# Patient Record
Sex: Male | Born: 1970
Health system: Southern US, Community
[De-identification: ages and names within clinical notes are randomized; demographics above are authoritative.]

## PROBLEM LIST (undated history)

## (undated) DIAGNOSIS — K469 Unspecified abdominal hernia without obstruction or gangrene: Secondary | ICD-10-CM

## (undated) DIAGNOSIS — K219 Gastro-esophageal reflux disease without esophagitis: Secondary | ICD-10-CM

## (undated) DIAGNOSIS — I10 Essential (primary) hypertension: Secondary | ICD-10-CM

## (undated) DIAGNOSIS — E7800 Pure hypercholesterolemia, unspecified: Secondary | ICD-10-CM

## (undated) DIAGNOSIS — R079 Chest pain, unspecified: Secondary | ICD-10-CM

## (undated) HISTORY — DX: Chest pain, unspecified: R07.9

## (undated) HISTORY — PX: HERNIA REPAIR: SHX51

## (undated) HISTORY — DX: Pure hypercholesterolemia, unspecified: E78.00

## (undated) HISTORY — DX: Gastro-esophageal reflux disease without esophagitis: K21.9

## (undated) HISTORY — DX: Unspecified abdominal hernia without obstruction or gangrene: K46.9

---

## 1999-05-21 HISTORY — PX: LAPAROSCOPIC INGUINAL HERNIA REPAIR: SUR788

## 1999-12-17 ENCOUNTER — Ambulatory Visit (HOSPITAL_COMMUNITY): Admission: RE | Admit: 1999-12-17 | Discharge: 1999-12-17 | Payer: Self-pay

## 2005-12-19 ENCOUNTER — Emergency Department (HOSPITAL_COMMUNITY): Admission: EM | Admit: 2005-12-19 | Discharge: 2005-12-19 | Payer: Self-pay | Admitting: Emergency Medicine

## 2007-08-12 ENCOUNTER — Emergency Department (HOSPITAL_COMMUNITY): Admission: EM | Admit: 2007-08-12 | Discharge: 2007-08-12 | Payer: Self-pay | Admitting: Emergency Medicine

## 2008-05-20 HISTORY — PX: INGUINAL HERNIA REPAIR: SUR1180

## 2008-06-22 ENCOUNTER — Ambulatory Visit (HOSPITAL_BASED_OUTPATIENT_CLINIC_OR_DEPARTMENT_OTHER): Admission: RE | Admit: 2008-06-22 | Discharge: 2008-06-22 | Payer: Self-pay | Admitting: Surgery

## 2010-10-02 NOTE — Op Note (Signed)
NAMEULES, MARSALA                ACCOUNT NO.:  [id number]   MEDICAL RECORD NO.:  [id number]          PATIENT TYPE:  AMB   LOCATION:  DSC                          FACILITY:  [facility]   PHYSICIAN:  Currie Paris, M.D.DATE OF BIRTH:  10/29/1970   DATE OF PROCEDURE:  06/22/2008  DATE OF DISCHARGE:                               OPERATIVE REPORT   PREOPERATIVE DIAGNOSIS:  Recurrent left inguinal hernia.   POSTOPERATIVE DIAGNOSIS:  Recurrent left inguinal hernia.   OPERATION:  Repair with mesh.   SURGEON:  Currie Paris, MD   ANESTHESIA:  General.   CLINICAL HISTORY:  This is a 40 year old gentleman who has had a right  inguinal hernia repair and then subsequently had a laparoscopic  bilateral hernia repair apparently, the right side had recurred.  He now  presented with a left inguinal hernia, which was therefore a recurrent  left inguinal hernia.  It was fairly large and felt to be direct.   DESCRIPTION OF PROCEDURE:  The patient was seen in the holding area and  had no further questions.  We reviewed the plans for surgery as noted  above.  He and I both initialed the left inguinal area as the operative  site.   The patient was taken to the operating room and after satisfactory  general anesthesia had been obtained, the left groin area was prepped  and draped.  The time-out was done.   I injected a combination of 1% Xylocaine with epi and 0.5% plain  Marcaine along the incision line and then below the fascia at the  anterior superior iliac spine.  I made an inguinal incision and divided  the subcutaneous tissues down to the external oblique aponeurosis.  There was a dilated superficial ring and I opened the external oblique  in the line of its fibers, preserving and identifying the  iliohypogastric and ilioinguinal nerve.   A self-retaining retractor was placed.  The cord structures were  dissected off the inguinal floor and surrounded with a Penrose drain.  I  carefully inspected the cord and there was no evidence of any indirect  sac.  The recurrence all appeared to be direct and there was a large  amount of preperitoneal fatty tissue protruding through.  Some of this  was stuck to the posterior aspect of the cord and this was freed up.  This also stuck medially to the pubic tubercle and this was likewise  freed up.   Once I had everything freed up, I went ahead  to close transversalis  with a running 2-0 Prolene simply to keep the bulge reduced while I  prepared to put some mesh in.  Because I thought this might create a  little tension, I also did a medial relaxing incision.   I then took a piece of Marlex mesh and cut it in an oval shape medially  and squared off laterally and cut laterally to go around the cord.  This  was secured with a running 2-0 Prolene starting at the pubic tubercle  and running all the way to the deep ring.  I  then overlaid the entire  inguinal floor medially and was sutured down medially to internal  oblique and as needed.  The lateral aspects were crossed lateral to the  cord and tacked down and went well lateral to reconstruct the deep ring.   I made sure everything was dry.  I put additional local in as I worked  and I used a total of almost 40 mL.  Everything appeared to be dry.   The repair was complete by closing the external oblique over the repair,  Scarpa's, and then skin with 4-0 Monocryl subcuticular and Dermabond.   The patient tolerated the procedure well and there were no  complications.  All counts were correct.      Currie Paris, M.D.  Electronically Signed     CJS/MEDQ  D:  06/22/2008  T:  06/23/2008  Job:  [id number]   cc:   Caryn Bee L. Little, M.D.

## 2010-10-05 NOTE — Op Note (Signed)
[facility]  Patient:    Michael Vazquez, Michael Vazquez                       MRN: [id number] Proc. Date: 12/17/99 Adm. Date:  [id number] Attending:  Meredith Leeds CC:         Vikki Ports, M.D.                           Operative Report  PREOPERATIVE DIAGNOSIS:  Bilateral direct inguinal hernia.  POSTOPERATIVE DIAGNOSIS:  Bilateral direct inguinal hernia.  OPERATION PERFORMED:  Laparoscopic bilateral inguinal hernia repair.  SURGEON:  Dr. Orson Slick.  ASSISTANT:  Dr. Ezzard Standing.  ANESTHESIA:  General.  CLINICAL NOTE:  The patient is a healthy 40 year old man with painful left inguinal bulge. He had a right inguinal hernia about 10 or 12 years ago and was found to have a bulge there as well when I saw him in the office. He agrees to laparoscopic bilateral inguinal hernia repair. He is aware of possible complications of bleeding, infection, recurrence of the hernia, injury to the bowel or bladder. He accepts the risks.  DESCRIPTION OF PROCEDURE:  After adequate monitoring and general anesthesia and routine preparation and draping of the abdomen, I made a short transverse infraumbilical incision and then incised the anterior rectus sheath on the right side. I dissected down with my finger posterior to the rectus muscle and entered the preperitoneal plane dissecting with the finger right down to the pubic symphysis. I then put in the dilating balloon and blew it up fully and it evidently did nice dissection. I could visualize the hernia bilaterally and see the Coopers ligament bilaterally. I then withdrew the balloon and put in a laparoscopic port, blew up that balloon and made a field with the port in the usual way. Putting in the camera, I saw the same structures again. I achieved a dilatation of the preperitoneal space with the CO2. I then put in 2 ports, one on each side, 5 mm in size and got good access to the preperitoneal space under direct vision. I  then did a little bit of blunt dissection laterally on each side sweeping the peritoneum laterally. I clearly delineated Coopers ligament, the inferior epigastric artery and the spermatic cord on each side and swept the tissues away from the anterior abdominal wall well enough to allow placement of a very generous piece of mesh on each side. I worked first on the right to apply the mesh. I cut a piece of mesh which was 4 x 6 inches and then trimmed the lateral edge to taper it somewhat, put it through the camera port and then spread it out with the graspers on each side until it lay over the hernia defect and dissected area. I then used the laparoscopic tacking device placing the first tacks at the lacunar ligament and pubic symphysis and then along the medial aspect of the mesh up onto the anterior abdominal wall. I then tacked it down onto Coopers ligament and stopped short of the femoral vein and then tacked it across the anterior abdominal wall until I was able to tack it well lateral to the spermatic cord feeling the area until it was obviously tacked about out to the anterior superior iliac spine. The mesh lay nice and flat and covered the hernia defect with a lot of room on each side and covered the spermatic cord and inferior epigastric artery.  There was no bleeding evident. I then followed an identical procedure on the left side and overlapped the mesh slightly and put a couple of tacks that went through and through both pieces of mesh in the midline and again got good lateral extension of the mesh, good coverage of the hernia defect going well up onto the anterior abdominal wall and well down below Coopers ligament and over the femoral vessels, inferior epigastric vessels and the cord structures. Again hemostasis was not a problem. I saw no holes in the peritoneum. I anesthetized the incisions. I removed the gas in the ports and then closed the skin with intracuticular 4-0 Vicryl  and Steri-Strips. Sponge, needle and instrument counts were correct. The patient was stable through the operation. DD:  12/17/99 TD:  12/18/99 Job: [id number]

## 2010-12-14 ENCOUNTER — Encounter (INDEPENDENT_AMBULATORY_CARE_PROVIDER_SITE_OTHER): Payer: Self-pay | Admitting: General Surgery

## 2010-12-14 ENCOUNTER — Ambulatory Visit (INDEPENDENT_AMBULATORY_CARE_PROVIDER_SITE_OTHER): Payer: 59 | Admitting: General Surgery

## 2010-12-14 ENCOUNTER — Encounter (INDEPENDENT_AMBULATORY_CARE_PROVIDER_SITE_OTHER): Payer: Self-pay | Admitting: Surgery

## 2010-12-14 VITALS — BP 130/86 | HR 80 | Temp 96.6°F | Ht 71.0 in | Wt 199.2 lb

## 2010-12-14 DIAGNOSIS — R1032 Left lower quadrant pain: Secondary | ICD-10-CM

## 2010-12-14 NOTE — Patient Instructions (Signed)
Continue normal activities. If the pain continues Dr. Darylene Price re\re evaluate should you in 3 months. If you've notice a bulge in the left or right groin please call and see Dr. Rondall Allegra

## 2010-12-14 NOTE — Progress Notes (Signed)
Subjective:     Patient ID: Michael Vazquez, male   DOB: [birth year], 40 y.o.   MRN: [id number]  HPI  Left groin pain next 2 years following a left inguinal hernia   by Dr. Jamey Ripa  Review of Systems     Objective:   Physical Exam On physical exam the patient has a well-healed left inguinal incision and with straining with him lying flat and standing I feel no bulge. His genitalia left testicle is normal. I examined him with the ultrasound machine and could not see any weakness that looks like a recurrent left inguinal hernia. I did not do a rectal examination and the patient states he's not having any difficulty voiding.  Also had Dr. strictures in the office today reexamine the patient and he said everything feels fine and is no bulge no weakness and he does not think the patient has a recurrent inguinal hernia     Assessment:         Plan:    The patient continues to do normal activity. He says that since surgery intermittently he is had a little intermittent pain but at no time has he ever noticed any bulge I would recommend that if he continues to have pain that he let Dr. Jamey Ripa reexamine him in approximately 3 months or if he still notices a bulge during that interval to call and let Dr. Jamey Ripa see him sooner

## 2011-02-11 LAB — POCT I-STAT, CHEM 8
Calcium, Ion: 1.23
Creatinine, Ser: 1
Hemoglobin: 16.7
Sodium: 140
TCO2: 28

## 2011-02-11 LAB — POCT CARDIAC MARKERS
Myoglobin, poc: 47.7
Operator id: 294501

## 2011-02-14 ENCOUNTER — Encounter (INDEPENDENT_AMBULATORY_CARE_PROVIDER_SITE_OTHER): Payer: 59 | Admitting: General Surgery

## 2011-10-01 ENCOUNTER — Encounter: Payer: Self-pay | Admitting: *Deleted

## 2012-04-07 ENCOUNTER — Encounter: Payer: Self-pay | Admitting: Cardiology

## 2017-04-12 ENCOUNTER — Other Ambulatory Visit: Payer: Self-pay

## 2017-04-12 ENCOUNTER — Encounter: Payer: Self-pay | Admitting: Physician Assistant

## 2017-04-12 ENCOUNTER — Ambulatory Visit (INDEPENDENT_AMBULATORY_CARE_PROVIDER_SITE_OTHER): Payer: 59 | Admitting: Physician Assistant

## 2017-04-12 VITALS — BP 150/84 | HR 89 | Temp 98.6°F | Resp 18 | Ht 71.0 in | Wt 229.4 lb

## 2017-04-12 DIAGNOSIS — J069 Acute upper respiratory infection, unspecified: Secondary | ICD-10-CM | POA: Diagnosis not present

## 2017-04-12 DIAGNOSIS — H66002 Acute suppurative otitis media without spontaneous rupture of ear drum, left ear: Secondary | ICD-10-CM

## 2017-04-12 DIAGNOSIS — B9789 Other viral agents as the cause of diseases classified elsewhere: Secondary | ICD-10-CM | POA: Diagnosis not present

## 2017-04-12 MED ORDER — AMOXICILLIN 875 MG PO TABS: 875.0000 mg | ORAL_TABLET | Freq: Two times a day (BID) | ORAL | 0 refills | Status: AC

## 2017-04-12 MED ORDER — BENZONATATE 100 MG PO CAPS: 100.0000 mg | ORAL_CAPSULE | Freq: Three times a day (TID) | ORAL | 0 refills | Status: DC | PRN

## 2017-04-12 MED ORDER — AZELASTINE HCL 0.1 % NA SOLN: 2.0000 | Freq: Two times a day (BID) | NASAL | 0 refills | Status: DC

## 2017-04-12 NOTE — Progress Notes (Signed)
Patient ID: Michael FusiJoseph M Whelchel, male     DOB: 02/29/1971, 46 y.o.    MRN: [id number]  PCP: Patient, No Pcp Per  Chief Complaint  Patient presents with  . Ear Pain    mostly left ear but both are sore x1week   . Cough    Subjective:   This patient is new to me and presents for evaluation of ear pain x 1 week, cough x 3-4 weeks.  OTC sweet oil helped the ear pain, but then came right back.  OTC DayQuil, NyQuil and Mucinex helped, but did not resolve it. Symptoms began following a cruise. Mild runny nose x 1 week. Sore throat x 2 days, resolved. No muscle or joint pain. No headache or dizziness. No fever or chills. No nausea, vomiting or diarrhea..  Review of Systems As above  Prior to Admission medications   Not on File     No Known Allergies   There are no active problems to display for this patient.    History reviewed. No pertinent family history.   Social History   Socioeconomic History  . Marital status: Single    Spouse name: Not on file  . Number of children: Not on file  . Years of education: Not on file  . Highest education level: Not on file  Social Needs  . Financial resource strain: Not on file  . Food insecurity - worry: Not on file  . Food insecurity - inability: Not on file  . Transportation needs - medical: Not on file  . Transportation needs - non-medical: Not on file  Occupational History  . Not on file  Tobacco Use  . Smoking status: Never Smoker  . Smokeless tobacco: Never Used  Substance and Sexual Activity  . Alcohol use: Yes    Comment: socially  . Drug use: No  . Sexual activity: Not on file  Other Topics Concern  . Not on file  Social History Narrative  . Not on file         Objective:  Physical Exam  Constitutional: He is oriented to person, place, and time. He appears well-developed and well-nourished. No distress.  BP (!) 150/84   Pulse 89   Temp 98.6 F (37 C) (Oral)   Resp 18   Ht 5\' 11"  (1.803 m)    Wt 229 lb 6.4 oz (104.1 kg)   SpO2 95%   BMI 31.99 kg/m    HENT:  Head: Normocephalic and atraumatic.  Right Ear: Hearing, tympanic membrane, external ear and ear canal normal.  Left Ear: Hearing, external ear and ear canal normal. No lacerations. No drainage, swelling or tenderness. No foreign bodies. No mastoid tenderness. Tympanic membrane is injected and erythematous. Tympanic membrane is not scarred and not perforated. No hemotympanum. No decreased hearing is noted.  Nose: Mucosal edema present. No rhinorrhea.  No foreign bodies. Right sinus exhibits no maxillary sinus tenderness and no frontal sinus tenderness. Left sinus exhibits no maxillary sinus tenderness and no frontal sinus tenderness.  Mouth/Throat: Uvula is midline, oropharynx is clear and moist and mucous membranes are normal. No uvula swelling. No oropharyngeal exudate.  Eyes: Conjunctivae and EOM are normal. Pupils are equal, round, and reactive to light. Right eye exhibits no discharge. Left eye exhibits no discharge. No scleral icterus.  Neck: Trachea normal, normal range of motion and full passive range of motion without pain. Neck supple. No thyroid mass and no thyromegaly present.  Cardiovascular: Normal rate, regular rhythm and  normal heart sounds.  Pulmonary/Chest: Effort normal and breath sounds normal.  Lymphadenopathy:       Head (right side): No submandibular, no tonsillar, no preauricular, no posterior auricular and no occipital adenopathy present.       Head (left side): No submandibular, no tonsillar, no preauricular and no occipital adenopathy present.    He has no cervical adenopathy.       Right: No supraclavicular adenopathy present.       Left: No supraclavicular adenopathy present.  Neurological: He is alert and oriented to person, place, and time. He has normal strength. No cranial nerve deficit or sensory deficit.  Skin: Skin is warm, dry and intact. No rash noted.  Psychiatric: He has a normal mood and  affect. His speech is normal and behavior is normal.             Assessment & Plan:  1. Viral URI with cough Persisting due to to #2. Supportive care. - benzonatate (TESSALON) 100 MG capsule; Take 1-2 capsules (100-200 mg total) by mouth 3 (three) times daily as needed for cough.  Dispense: 40 capsule; Refill: 0 - azelastine (ASTELIN) 0.1 % nasal spray; Place 2 sprays into both nostrils 2 (two) times daily. Use in each nostril as directed  Dispense: 30 mL; Refill: 0  2. Acute suppurative otitis media of left ear without spontaneous rupture of tympanic membrane, recurrence not specified Due to #1.  - amoxicillin (AMOXIL) 875 MG tablet; Take 1 tablet (875 mg total) by mouth 2 (two) times daily for 10 days.  Dispense: 20 tablet; Refill: 0    Return if symptoms worsen or fail to improve.   Fernande Brashelle S. Ramona Slinger, PA-C Primary [facility] at [facility]

## 2017-04-12 NOTE — Patient Instructions (Addendum)
   IF you received an x-ray today, you will receive an invoice from [facility]. Please contact [facility] at [phone] with questions or concerns regarding your invoice.   IF you received labwork today, you will receive an invoice from [facility]. Please contact [facility] at [phone] with questions or concerns regarding your invoice.   Our billing staff will not be able to assist you with questions regarding bills from these companies.  You will be contacted with the lab results as soon as they are available. The fastest way to get your results is to activate your My Chart account. Instructions are located on the last page of this paperwork. If you have not heard from us regarding the results in 2 weeks, please contact this office.     Otitis Media, Adult Otitis media is redness, soreness, and puffiness (swelling) in the space just behind your eardrum (middle ear). It may be caused by allergies or infection. It often happens along with a cold. Follow these instructions at home:  Take your medicine as told. Finish it even if you start to feel better.  Only take over-the-counter or prescription medicines for pain, discomfort, or fever as told by your doctor.  Follow up with your doctor as told. Contact a doctor if:  You have otitis media only in one ear, or bleeding from your nose, or both.  You notice a lump on your neck.  You are not getting better in 3-5 days.  You feel worse instead of better. Get help right away if:  You have pain that is not helped with medicine.  You have puffiness, redness, or pain around your ear.  You get a stiff neck.  You cannot move part of your face (paralysis).  You notice that the bone behind your ear hurts when you touch it. This information is not intended to replace advice given to you by your health care provider. Make sure you discuss any questions you have with your health care provider. Document Released:  10/23/2007 Document Revised: 10/12/2015 Document Reviewed: 12/01/2012 Elsevier Interactive Patient Education  2017 Elsevier Inc.  Upper Respiratory Infection, Adult Most upper respiratory infections (URIs) are caused by a virus. A URI affects the nose, throat, and upper air passages. The most common type of URI is often called "the common cold." Follow these instructions at home:  Take medicines only as told by your doctor.  Gargle warm saltwater or take cough drops to comfort your throat as told by your doctor.  Use a warm mist humidifier or inhale steam from a shower to increase air moisture. This may make it easier to breathe.  Drink enough fluid to keep your pee (urine) clear or pale yellow.  Eat soups and other clear broths.  Have a healthy diet.  Rest as needed.  Go back to work when your fever is gone or your doctor says it is okay. ? You may need to stay home longer to avoid giving your URI to others. ? You can also wear a face mask and wash your hands often to prevent spread of the virus.  Use your inhaler more if you have asthma.  Do not use any tobacco products, including cigarettes, chewing tobacco, or electronic cigarettes. If you need help quitting, ask your doctor. Contact a doctor if:  You are getting worse, not better.  Your symptoms are not helped by medicine.  You have chills.  You are getting more short of breath.  You have brown or red mucus.    You have yellow or brown discharge from your nose.  You have pain in your face, especially when you bend forward.  You have a fever.  You have puffy (swollen) neck glands.  You have pain while swallowing.  You have white areas in the back of your throat. Get help right away if:  You have very bad or constant: ? Headache. ? Ear pain. ? Pain in your forehead, behind your eyes, and over your cheekbones (sinus pain). ? Chest pain.  You have long-lasting (chronic) lung disease and any of the  following: ? Wheezing. ? Long-lasting cough. ? Coughing up blood. ? A change in your usual mucus.  You have a stiff neck.  You have changes in your: ? Vision. ? Hearing. ? Thinking. ? Mood. This information is not intended to replace advice given to you by your health care provider. Make sure you discuss any questions you have with your health care provider. Document Released: 10/23/2007 Document Revised: 01/07/2016 Document Reviewed: 08/11/2013 Elsevier Interactive Patient Education  2018 Elsevier Inc.  

## 2017-05-04 ENCOUNTER — Other Ambulatory Visit: Payer: Self-pay | Admitting: Physician Assistant

## 2017-05-04 DIAGNOSIS — J069 Acute upper respiratory infection, unspecified: Secondary | ICD-10-CM

## 2017-05-04 DIAGNOSIS — B9789 Other viral agents as the cause of diseases classified elsewhere: Principal | ICD-10-CM

## 2017-08-23 ENCOUNTER — Ambulatory Visit (INDEPENDENT_AMBULATORY_CARE_PROVIDER_SITE_OTHER): Payer: 59 | Admitting: Physician Assistant

## 2017-08-23 ENCOUNTER — Encounter: Payer: Self-pay | Admitting: Physician Assistant

## 2017-08-23 ENCOUNTER — Other Ambulatory Visit: Payer: Self-pay

## 2017-08-23 VITALS — BP 144/90 | HR 98 | Temp 99.0°F | Resp 16 | Ht 69.75 in | Wt 227.6 lb

## 2017-08-23 DIAGNOSIS — R21 Rash and other nonspecific skin eruption: Secondary | ICD-10-CM | POA: Diagnosis not present

## 2017-08-23 DIAGNOSIS — L304 Erythema intertrigo: Secondary | ICD-10-CM | POA: Diagnosis not present

## 2017-08-23 LAB — GLUCOSE, POCT (MANUAL RESULT ENTRY): POC Glucose: 84 mg/dl (ref 70–99)

## 2017-08-23 MED ORDER — CLOTRIMAZOLE 1 % EX CREA: 1.0000 "application " | TOPICAL_CREAM | Freq: Two times a day (BID) | CUTANEOUS | 0 refills | Status: DC

## 2017-08-23 MED ORDER — NYSTATIN 100000 UNIT/GM EX POWD: Freq: Two times a day (BID) | CUTANEOUS | 0 refills | Status: DC

## 2017-08-23 MED ORDER — HYDROCORTISONE 2.5 % EX CREA: TOPICAL_CREAM | Freq: Two times a day (BID) | CUTANEOUS | 0 refills | Status: DC

## 2017-08-23 NOTE — Progress Notes (Signed)
   Michael FusiJoseph M Ericson  MRN: [id number] DOB: 05/31/1970  Subjective:   Michael Vazquez is a 47 y.o. male who presents for evaluation of a rash involving the buttocks and groin region. Rash started 6 months ago. Rash has changed over time. Rash is pruritic. Notes it burns especially after showering. Associated symptoms: none. Patient denies: abdominal pain, arthralgia, congestion, cough, decrease in appetite, decrease in energy level, fever, headache, irritability, myalgia, nausea and sore throat. Has tried tinactin with some relief. Patient has not had contacts with similar rash. Patient has not had new exposures (soaps, lotions, laundry detergents, foods, medications, plants, insects or animals). He is not nor has he ever been sexually active.   Review of Systems  Per HPI  There are no active problems to display for this patient.   No current outpatient medications on file prior to visit.   No current facility-administered medications on file prior to visit.     No Known Allergies   Objective:  BP (!) 144/90   Pulse 98   Temp 99 F (37.2 C)   Resp 16   Ht 5' 9.75" (1.772 m)   Wt 227 lb 9.6 oz (103.2 kg)   SpO2 95%   BMI 32.89 kg/m   Physical Exam  Constitutional: He is oriented to person, place, and time. He appears well-developed and well-nourished.  HENT:  Head: Normocephalic and atraumatic.  Eyes: Conjunctivae are normal.  Neck: Normal range of motion.  Pulmonary/Chest: Effort normal.  Neurological: He is alert and oriented to person, place, and time.  Skin: Skin is warm and dry. Rash ( Erythematous macular rash with slight maceration of gluteal crease and left inguinal crease. Few excoriations noted.  ) noted.  Psychiatric: He has a normal mood and affect.  Vitals reviewed.  CMA present in the room for GU exam.  Results for orders placed or performed in visit on 08/23/17 (from the past 24 hour(s))  POCT glucose (manual entry)     Status: Normal   Collection Time: 08/23/17  11:43 AM  Result Value Ref Range   POC Glucose 84 70 - 99 mg/dl   Assessment and Plan :  1. Intertrigo 2. Rash and nonspecific skin eruption  Rash consistent with intertrigo.  Given educational material on practices aimed at minimizing moisture frequently involved area.  Advised to return to clinic if symptoms worsen, do not improve in 4 weeks, or as needed.  - POCT glucose (manual entry) - clotrimazole (LOTRIMIN) 1 % cream; Apply 1 application topically 2 (two) times daily.  Dispense: 30 g; Refill: 0 - hydrocortisone 2.5 % cream; Apply topically 2 (two) times daily.  Dispense: 30 g; Refill: 0 - nystatin (NYSTATIN) powder; Apply topically 2 (two) times daily.  Dispense: 15 g; Refill: 0  Benjiman CoreBrittany Cypher Paule PA-C  [facility] at [facility] 08/23/2017 11:18 AM

## 2017-08-23 NOTE — Patient Instructions (Addendum)
TREATMENT-Treatment of intertrigo is advised to improve symptoms and minimize risk for complications related to secondary infection. Although intertrigo is common, data on treatment are limited. The few randomized trials performed have methodologic flaws and considerable risk of bias [6]. A systematic review found insufficient evidence to confirm efficacy of any particular approach to treatment [6].   Practices aimed at minimizing moisture and friction in the involved area and reducing susceptibility to intertrigo are the mainstays of treatment. Typical beneficial practices include:  ?Daily cleansing of affected skin with a mild cleanser followed by drying of affected area with a hair dryer on a cool setting ?Aeration of affected area when feasible ?Daily application of drying powders ?Use of absorbent material or clothing, such as cotton or merino wool, to separate skin in folds ?Application of barrier creams in areas that may come in contact with urine or feces ?Treatment of hyperhidrosis in the affected area ?Weight loss in overweight or obese patients ?Appropriate treatment of coexisting diabetes mellitus  In addition to these measures, we routinely prescribe recommend clotrimazole cream applied once- or twice-daily  two to four weeks.  You can also use hydrocortisone cream twice daily for one week, once daily for one week, and every other day for one week  I have also given you a powder to use up to twice a day to help dry the area out.   If no full improvement in 4 weeks, please return.  Intertrigo Intertrigo is skin irritation (inflammation) that happens in warm, moist areas of the body. The irritation can cause a rash and make skin raw and itchy. The rash is usually pink or red. It happens mostly between folds of skin or where skin rubs together, such as:  Toes.  Armpits.  Groin.  Belly.  Breasts.  Buttocks.  This condition is not passed from person to person (is not  contagious). Follow these instructions at home:  Keep the affected area clean and dry.  Do not scratch your skin.  Stay cool as much as possible. Use an air conditioner or fan, if you can.  Apply over-the-counter and prescription medicines only as told by your doctor.  If you were prescribed an antibiotic medicine, use it as told by your doctor. Do not stop using the antibiotic even if your condition starts to get better.  Keep all follow-up visits as told by your doctor. This is important. How is this prevented?  Stay at a healthy weight.  Keep your feet dry. This is very important if you have diabetes. Wear cotton or wool socks.  Take care of and protect the skin in your groin and butt area as told by your doctor.  Do not wear tight clothes. Wear clothes that: ? Are loose. ? Take away moisture from your body. ? Are made of cotton.  Wear a bra that gives good support, if needed.  Shower and dry yourself fully after being active.  Keep your blood sugar under control if you have diabetes. Contact a doctor if:  Your symptoms do not get better with treatment.  Your symptoms get worse or they spread.  You notice more redness and warmth.  You have a fever. This information is not intended to replace advice given to you by your health care provider. Make sure you discuss any questions you have with your health care provider. Document Released: 06/08/2010 Document Revised: 10/12/2015 Document Reviewed: 11/07/2014 Elsevier Interactive Patient Education  2018 [employer].    IF you received an x-ray today,  you will receive an invoice from [facility]. Please contact [facility] at [phone] with questions or concerns regarding your invoice.   IF you received labwork today, you will receive an invoice from [facility]. Please contact [facility] at [phone] with questions or concerns regarding your invoice.   Our billing staff will not be able to assist  you with questions regarding bills from these companies.  You will be contacted with the lab results as soon as they are available. The fastest way to get your results is to activate your My Chart account. Instructions are located on the last page of this paperwork. If you have not heard from us regarding the results in 2 weeks, please contact this office.

## 2017-08-29 ENCOUNTER — Encounter: Payer: Self-pay | Admitting: Physician Assistant

## 2017-09-06 ENCOUNTER — Other Ambulatory Visit: Payer: Self-pay | Admitting: Physician Assistant

## 2017-09-06 DIAGNOSIS — L304 Erythema intertrigo: Secondary | ICD-10-CM

## 2017-09-08 ENCOUNTER — Other Ambulatory Visit: Payer: Self-pay | Admitting: Physician Assistant

## 2017-09-08 DIAGNOSIS — L304 Erythema intertrigo: Secondary | ICD-10-CM

## 2017-09-08 NOTE — Telephone Encounter (Signed)
LOV 08/23/17 Benjiman CoreBrittany Wiseman Lasr refill 08/23/17

## 2017-09-10 NOTE — Telephone Encounter (Signed)
mychart message sent to pt about making an apt for med refills °

## 2017-09-20 ENCOUNTER — Ambulatory Visit (INDEPENDENT_AMBULATORY_CARE_PROVIDER_SITE_OTHER): Payer: 59 | Admitting: Physician Assistant

## 2017-09-20 ENCOUNTER — Encounter: Payer: Self-pay | Admitting: Physician Assistant

## 2017-09-20 ENCOUNTER — Telehealth: Payer: Self-pay | Admitting: *Deleted

## 2017-09-20 ENCOUNTER — Other Ambulatory Visit: Payer: Self-pay

## 2017-09-20 VITALS — BP 120/90 | HR 84 | Temp 98.8°F | Ht 70.0 in | Wt 229.8 lb

## 2017-09-20 DIAGNOSIS — B372 Candidiasis of skin and nail: Secondary | ICD-10-CM

## 2017-09-20 DIAGNOSIS — H938X3 Other specified disorders of ear, bilateral: Secondary | ICD-10-CM | POA: Diagnosis not present

## 2017-09-20 MED ORDER — FLUCONAZOLE 150 MG PO TABS: 150.0000 mg | ORAL_TABLET | ORAL | 1 refills | Status: DC

## 2017-09-20 NOTE — Progress Notes (Signed)
Michael Vazquez  MRN: [id number] DOB: July 06, 1970  Subjective:  Michael Vazquez is a 47 y.o. male seen in office today for a chief complaint of follow-up on intertrigo of buttocks and groin.  Patient was initially seen on 08/23/2017.  His rash had been present for 6 months at that time.  Treated with clotrimazole cream, hydrocodone cream, nystatin powder. Today, notes the rash got better with the tx but then returned about a week after he ran out of the cream. Has associated itching. Still trying to keep area as dry as possible. Feels most irritation in groin but thinks it is starting to develop in buttocks area again as well. Patient denies: abdominal pain, arthralgia, congestion, cough, decrease in appetite, decrease in energy level, fever, headache, irritability, myalgia, nausea and sore throat. Also having ear irritation in bilateral ears. Has some pressure. Denies pain, itching, hearing loss, tiniutis, and dizziness. Has tried debrox drops with no releif. Feels better today without using anything. No hx of seasonal allergies. Denies frequent use of qtips.   Review of Systems  Per HPI  There are no active problems to display for this patient.   Current Outpatient Medications on File Prior to Visit  Medication Sig Dispense Refill  . clotrimazole (LOTRIMIN) 1 % cream Apply 1 application topically 2 (two) times daily. (Patient not taking: Reported on 09/20/2017) 30 g 0  . hydrocortisone 2.5 % cream Apply topically 2 (two) times daily. (Patient not taking: Reported on 09/20/2017) 30 g 0  . nystatin (NYSTATIN) powder Apply topically 2 (two) times daily. (Patient not taking: Reported on 09/20/2017) 15 g 0   No current facility-administered medications on file prior to visit.     No Known Allergies    Social History   Socioeconomic History  . Marital status: Single    Spouse name: Not on file  . Number of children: 0  . Years of education: 2 associate's degrees  . Highest education level: Associate  degree: occupational, Hotel manager, or vocational program  Occupational History  . 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 Financial resource strain: Not on file  . Food insecurity:    Worry: Not on file    Inability: Not on file  . Transportation needs:    Medical: Not on file    Non-medical: Not on file  Tobacco Use  . Smoking status: Never Smoker  . Smokeless tobacco: Never Used  Substance and Sexual Activity  . Alcohol use: Yes    Comment: socially  . Drug use: No  . Sexual activity: Not on file  Lifestyle  . Physical activity:    Days per week: Not on file    Minutes per session: Not on file  . Stress: Not on file  Relationships  . Social connections:    Talks on phone: Not on file    Gets together: Not on file    Attends religious service: Not on file    Active member of club or organization: Not on file    Attends meetings of clubs or organizations: Not on file    Relationship status: Not on file  . Intimate partner violence:    Fear of current or ex partner: Not on file    Emotionally abused: Not on file    Physically abused: Not on file    Forced sexual activity: Not on file  Other Topics Concern  . Not on file  Social History Narrative   Lives with his parents.    Objective:  BP 120/90 (BP Location: Left Arm, Patient Position: Sitting, Cuff Size: Normal)   Pulse 84   Temp 98.8 F (37.1 C) (Oral)   Ht '5\' 10"'  (1.778 m)   Wt 229 lb 12.8 oz (104.2 kg)   BMI 32.97 kg/m   Physical Exam  Constitutional: He is oriented to person, place, and time. He appears well-developed and well-nourished. No distress.  HENT:  Head: Normocephalic and atraumatic.  Right Ear: Tympanic membrane, external ear and ear canal normal. No swelling or tenderness. No mastoid tenderness. No middle ear effusion.  Left Ear: External ear and ear canal normal. No swelling or tenderness. No mastoid tenderness. Tympanic  membrane is injected (mild). Tympanic membrane is not bulging.  No middle ear effusion.  Nose: Nose normal.  Eyes: Conjunctivae are normal.  Neck: Normal range of motion.  Pulmonary/Chest: Effort normal.  Neurological: He is alert and oriented to person, place, and time.  Skin: Skin is warm and dry. Rash (Small affected area with eythematous macular rash of gluteal crease and left inguinal crease, much improved since inital visit  ) noted.  Psychiatric: He has a normal mood and affect.  Vitals reviewed.   Assessment and Plan :  1. Candidal intertrigo Appearance improved since initial visit; however, due to recurrence, will attempt trial of oral diflucan at this time. Encouraged to continue preventative measures. Labs pending. Advised to return to clinic if symptoms worsen, do not improve, or as needed.  - fluconazole (DIFLUCAN) 150 MG tablet; Take 1 tablet (150 mg total) by mouth once a week.  Dispense: 2 tablet; Refill: 1 - CMP14+EGFR - Care order/instruction: - HIV antibody 2. Irritation of both ears No acute findings on PE. Pt reassured. Encouraged him to try flonase, oral antihistamine, and/or oral decongestant in the future when he has these sx to see if it helps. Follow up as needed.    Tenna Delaine PA-C  [facility] at [facility] 09/20/2017 11:25 AM

## 2017-09-20 NOTE — Telephone Encounter (Signed)
Pt was discharged from office before getting labs. He stated that he will be here in 30 mins.

## 2017-09-20 NOTE — Patient Instructions (Addendum)
Your rash has improved. Since it is returning, we can try oral medication at this time. Please take tablets once weekly for the next 2 weeks. If no full resolution by then, there will be one refill for this medication for an additional two weeks of treatment at your pharmacy. Still work on keeping the area dry.  Your ears look fine today. I recommend using sudafed or flonase if you develop those symptoms again. Thank you for letting me participate in your health and well being.    Intertrigo Intertrigo is skin irritation (inflammation) that happens in warm, moist areas of the body. The irritation can cause a rash and make skin raw and itchy. The rash is usually pink or red. It happens mostly between folds of skin or where skin rubs together, such as:  Toes.  Armpits.  Groin.  Belly.  Breasts.  Buttocks.  This condition is not passed from person to person (is not contagious). Follow these instructions at home:  Keep the affected area clean and dry.  Do not scratch your skin.  Stay cool as much as possible. Use an air conditioner or fan, if you can.  Apply over-the-counter and prescription medicines only as told by your doctor.  If you were prescribed an antibiotic medicine, use it as told by your doctor. Do not stop using the antibiotic even if your condition starts to get better.  Keep all follow-up visits as told by your doctor. This is important. How is this prevented?  Stay at a healthy weight.  Keep your feet dry. This is very important if you have diabetes. Wear cotton or wool socks.  Take care of and protect the skin in your groin and butt area as told by your doctor.  Do not wear tight clothes. Wear clothes that: ? Are loose. ? Take away moisture from your body. ? Are made of cotton.  Wear a bra that gives good support, if needed.  Shower and dry yourself fully after being active.  Keep your blood sugar under control if you have diabetes. Contact a doctor  if:  Your symptoms do not get better with treatment.  Your symptoms get worse or they spread.  You notice more redness and warmth.  You have a fever. This information is not intended to replace advice given to you by your health care provider. Make sure you discuss any questions you have with your health care provider. Document Released: 06/08/2010 Document Revised: 10/12/2015 Document Reviewed: 11/07/2014 Elsevier Interactive Patient Education  2018 [employer].    IF you received an x-ray today, you will receive an invoice from [facility]. Please contact [facility] at [phone] with questions or concerns regarding your invoice.   IF you received labwork today, you will receive an invoice from [facility]. Please contact [facility] at [phone] with questions or concerns regarding your invoice.   Our billing staff will not be able to assist you with questions regarding bills from these companies.  You will be contacted with the lab results as soon as they are available. The fastest way to get your results is to activate your My Chart account. Instructions are located on the last page of this paperwork. If you have not heard from [place] regarding the results in 2 weeks, please contact this office.

## 2017-09-21 LAB — CMP14+EGFR
A/G RATIO: 1.3 (ref 1.2–2.2)
ALT: 38 IU/L (ref 0–44)
AST: 24 IU/L (ref 0–40)
Albumin: 4.2 g/dL (ref 3.5–5.5)
Alkaline Phosphatase: 65 IU/L (ref 39–117)
BILIRUBIN TOTAL: 0.8 mg/dL (ref 0.0–1.2)
BUN/Creatinine Ratio: 14 (ref 9–20)
BUN: 12 mg/dL (ref 6–24)
CHLORIDE: 105 mmol/L (ref 96–106)
CO2: 22 mmol/L (ref 20–29)
Calcium: 9.5 mg/dL (ref 8.7–10.2)
Creatinine, Ser: 0.83 mg/dL (ref 0.76–1.27)
GFR calc Af Amer: 122 mL/min/{1.73_m2} (ref >59)
GFR calc non Af Amer: 106 mL/min/{1.73_m2} (ref >59)
GLUCOSE: 128 mg/dL — AB (ref 65–99)
Globulin, Total: 3.2 g/dL (ref 1.5–4.5)
POTASSIUM: 4.4 mmol/L (ref 3.5–5.2)
Sodium: 142 mmol/L (ref 134–144)
Total Protein: 7.4 g/dL (ref 6.0–8.5)

## 2017-09-21 LAB — HIV ANTIBODY (ROUTINE TESTING W REFLEX): HIV SCREEN 4TH GENERATION: NONREACTIVE

## 2017-09-25 ENCOUNTER — Ambulatory Visit: Payer: 59 | Admitting: Physician Assistant

## 2017-09-30 ENCOUNTER — Other Ambulatory Visit: Payer: Self-pay | Admitting: Physician Assistant

## 2017-09-30 ENCOUNTER — Encounter: Payer: Self-pay | Admitting: Physician Assistant

## 2017-09-30 DIAGNOSIS — L304 Erythema intertrigo: Secondary | ICD-10-CM

## 2017-09-30 MED ORDER — CLOTRIMAZOLE 1 % EX CREA
1.0000 | TOPICAL_CREAM | Freq: Two times a day (BID) | CUTANEOUS | 0 refills | Status: DC
Start: 2017-09-30 — End: 2020-06-07

## 2017-09-30 MED ORDER — HYDROCORTISONE 2.5 % EX CREA: TOPICAL_CREAM | Freq: Two times a day (BID) | CUTANEOUS | 0 refills | Status: DC

## 2017-09-30 MED ORDER — NYSTATIN 100000 UNIT/GM EX POWD: Freq: Two times a day (BID) | CUTANEOUS | 0 refills | Status: DC

## 2017-09-30 NOTE — Progress Notes (Signed)
Meds ordered this encounter  Medications  . nystatin (NYSTATIN) powder    Sig: Apply topically 2 (two) times daily.    Dispense:  15 g    Refill:  0    Order Specific Question:   Supervising Provider    Answer:   Ethelda Chick [[id number]]  . clotrimazole (LOTRIMIN) 1 % cream    Sig: Apply 1 application topically 2 (two) times daily.    Dispense:  30 g    Refill:  0    Order Specific Question:   Supervising Provider    Answer:   Ethelda Chick [[id number]]  . hydrocortisone 2.5 % cream    Sig: Apply topically 2 (two) times daily.    Dispense:  30 g    Refill:  0    Order Specific Question:   Supervising Provider    Answer:   Ethelda Chick [[id number]]

## 2017-12-12 NOTE — Progress Notes (Signed)
Michael Vazquez  MRN: [id number] DOB: 08/25/1970  Subjective:    Michael Vazquez is an 47 y.o. male who presents for evaluation of left wrist pain. Onset was gradual, starting about a few weeks ago. The pain is moderate, worsens with movement, and is relieved by rest. Pain most notable at base of thumb.  There is no associated numbness, tingling, weakness in hand.  Denies redness and warmth.  There is no history of injury, strain. Evaluation to date: none. Treatment to date: nothing specific. Of note, spends lots of time on a computer while at work. He is actually not having pain today.   Review of Systems  Constitutional: Negative for chills, diaphoresis and fever.    There are no active problems to display for this patient.   Current Outpatient Medications on File Prior to Visit  Medication Sig Dispense Refill  . clotrimazole (LOTRIMIN) 1 % cream Apply 1 application topically 2 (two) times daily. 30 g 0  . fluconazole (DIFLUCAN) 150 MG tablet Take 1 tablet (150 mg total) by mouth once a week. 2 tablet 1  . hydrocortisone 2.5 % cream Apply topically 2 (two) times daily. 30 g 0  . nystatin (NYSTATIN) powder Apply topically 2 (two) times daily. 15 g 0   No current facility-administered medications on file prior to visit.     No Known Allergies   Objective:  BP 122/84 (BP Location: Left Arm, Patient Position: Sitting, Cuff Size: Normal)   Pulse 80   Temp 98.9 F (37.2 C) (Oral)   Ht 5' 10.5" (1.791 m)   Wt 236 lb 9.6 oz (107.3 kg)   SpO2 94%   BMI 33.47 kg/m   Physical Exam  Constitutional: He is oriented to person, place, and time. He appears well-developed and well-nourished. No distress.  HENT:  Head: Normocephalic and atraumatic.  Eyes: Conjunctivae are normal.  Neck: Normal range of motion.  Cardiovascular:  Pulses:      Dorsalis pedis pulses are 2+ on the right side, and 2+ on the left side.       Posterior tibial pulses are 2+ on the right side, and 2+ on the left  side.  Pulmonary/Chest: Effort normal.  Musculoskeletal:       Right wrist: Normal. He exhibits normal range of motion, no tenderness, no bony tenderness and no swelling.       Left wrist: Normal. He exhibits normal range of motion, no tenderness, no bony tenderness and no deformity.       Right hand: Normal. He exhibits normal range of motion, no tenderness, no bony tenderness and normal capillary refill. Normal sensation noted. Normal strength noted.       Left hand: Normal. He exhibits normal range of motion, no tenderness, no bony tenderness, normal two-point discrimination, normal capillary refill and no swelling. Normal sensation noted. Normal strength noted.  Left Hand Exam: Negative Phalen's maneuver Negative Finkelstein's test   Neurological: He is alert and oriented to person, place, and time.  Sensation of BUE intact.   Skin: Skin is warm and dry.  Psychiatric: He has a normal mood and affect.  Vitals reviewed.     BP Readings from Last 3 Encounters:  12/13/17 122/84  09/20/17 120/90  08/23/17 (!) 144/90    Assessment and Plan :  1. Left wrist pain Hx is consistent with tendonitis. Pt is actually asx today. Normal exam. He is neurovascularly intact.  Pain is worse when he is at work. Rec using splint while  at work if pain persists. When not at work, apply ice to affected area and avoid aggravating movements. May use OTC NSAIDS as prescribed prn.  - Splint wrist   Benjiman CoreBrittany Wiseman PA-C  [facility] at [facility]  [facility] 12/13/2017 2:23 PM

## 2017-12-13 ENCOUNTER — Ambulatory Visit: Payer: 59 | Admitting: Physician Assistant

## 2017-12-13 ENCOUNTER — Encounter: Payer: Self-pay | Admitting: Physician Assistant

## 2017-12-13 ENCOUNTER — Ambulatory Visit (INDEPENDENT_AMBULATORY_CARE_PROVIDER_SITE_OTHER): Payer: 59 | Admitting: Physician Assistant

## 2017-12-13 ENCOUNTER — Other Ambulatory Visit: Payer: Self-pay

## 2017-12-13 VITALS — BP 122/84 | HR 80 | Temp 98.9°F | Ht 70.5 in | Wt 236.6 lb

## 2017-12-13 DIAGNOSIS — M25532 Pain in left wrist: Secondary | ICD-10-CM

## 2017-12-13 MED ORDER — NAPROXEN 500 MG PO TBEC: 500.0000 mg | DELAYED_RELEASE_TABLET | Freq: Two times a day (BID) | ORAL | 0 refills | Status: DC

## 2017-12-13 NOTE — Patient Instructions (Addendum)
For wrist pain, this is most likely tendonitis.   I recommend using wrist splint while at work.  You can also use anti-inflammatories when the pain arises.  When you are not at work try to avoid aggravating movements.  You may also apply ice to the affected area for 20 minutes at a time.  In terms of elevated blood pressure, I would like you to check your blood pressure at least a couple times over the next week outside of the office and document these values. It is best if you check the blood pressure at different times in the day. Your goal is <140/90. If your values are consistently above this goal, please return to office for further evaluation. If you start to have chest pain, blurred vision, shortness of breath, severe headache, lower leg swelling, or nausea/vomiting please seek care immediately here or at the ED.   De Quervain Tenosynovitis Tendons attach muscles to bones. They also help with joint movements. When tendons become irritated or swollen, it is called tendinitis. The extensor pollicis brevis (EPB) tendon connects the EPB muscle to a bone that is near the base of the thumb. The EPB muscle helps to straighten and extend the thumb. De Quervain tenosynovitis is a condition in which the EPB tendon lining (sheath) becomes irritated, thickened, and swollen. This condition is sometimes called stenosing tenosynovitis. This condition causes pain on the thumb side of the back of the wrist. What are the causes? Causes of this condition include:  Activities that repeatedly cause your thumb and wrist to extend.  A sudden increase in activity or change in activity that affects your wrist.  What increases the risk? This condition is more likely to develop in:  Females.  People who have diabetes.  Women who have recently given birth.  People who are over 46 years of age.  People who do activities that involve repeated hand and wrist motions, such as tennis, racquetball, volleyball,  gardening, and taking care of children.  People who do heavy labor.  People who have poor wrist strength and flexibility.  People who do not warm up properly before activities.  What are the signs or symptoms? Symptoms of this condition include:  Pain or tenderness over the thumb side of the back of the wrist when your thumb and wrist are not moving.  Pain that gets worse when you straighten your thumb or extend your thumb or wrist.  Pain when the injured area is touched.  Locking or catching of the thumb joint while you bend and straighten your thumb.  Decreased thumb motion due to pain.  Swelling over the affected area.  How is this diagnosed? This condition is diagnosed with a medical history and physical exam. Your health care provider will ask for details about your injury and ask about your symptoms. How is this treated? Treatment may include the use of icing and medicines to reduce pain and swelling. You may also be advised to wear a splint or brace to limit your thumb and wrist motion. In less severe cases, treatment may also include working with a physical therapist to strengthen your wrist and calm the irritation around your EPB tendon sheath. In severe cases, surgery may be needed. Follow these instructions at home: If you have a splint or brace:  Wear it as told by your health care provider. Remove it only as told by your health care provider.  Loosen the splint or brace if your fingers become numb and tingle, or if  they turn cold and blue.  Keep the splint or brace clean and dry. Managing pain, stiffness, and swelling  If directed, apply ice to the injured area. ? Put ice in a plastic bag. ? Place a towel between your skin and the bag. ? Leave the ice on for 20 minutes, 2-3 times per day.  Move your fingers often to avoid stiffness and to lessen swelling.  Raise (elevate) the injured area above the level of your heart while you are sitting or lying  down. General instructions  Return to your normal activities as told by your health care provider. Ask your health care provider what activities are safe for you.  Take over-the-counter and prescription medicines only as told by your health care provider.  Keep all follow-up visits as told by your health care provider. This is important.  Do not drive or operate heavy machinery while taking prescription pain medicine. Contact a health care provider if:  Your pain, tenderness, or swelling gets worse, even if you have had treatment.  You have numbness or tingling in your wrist, hand, or fingers on the injured side. This information is not intended to replace advice given to you by your health care provider. Make sure you discuss any questions you have with your health care provider. Document Released: 05/06/2005 Document Revised: 10/12/2015 Document Reviewed: 07/12/2014 Elsevier Interactive Patient Education  2018 [employer].   IF you received an x-ray today, you will receive an invoice from [facility]. Please contact [facility] at [phone] with questions or concerns regarding your invoice.   IF you received labwork today, you will receive an invoice from [place] [facility]. Please contact [facility] at [phone] with questions or concerns regarding your invoice.   Our billing staff will not be able to assist you with questions regarding bills from these companies.  You will be contacted with the lab results as soon as they are available. The fastest way to get your results is to activate your My Chart account. Instructions are located on the last page of this paperwork. If you have not heard from [place] regarding the results in 2 weeks, please contact this office.

## 2017-12-18 ENCOUNTER — Encounter: Payer: Self-pay | Admitting: Physician Assistant

## 2017-12-29 ENCOUNTER — Other Ambulatory Visit: Payer: Self-pay | Admitting: Physician Assistant

## 2017-12-29 NOTE — Telephone Encounter (Signed)
Naprosyn refill Last Refill:12/13/17 # 30 Last OV: 12/13/17 PCP: Barnett AbuWiseman Pharmacy: [id number] - [place], [place] - [address] [phone] (Phone) [phone])

## 2019-02-12 ENCOUNTER — Other Ambulatory Visit: Payer: Self-pay | Admitting: Otolaryngology

## 2019-02-12 DIAGNOSIS — H9120 Sudden idiopathic hearing loss, unspecified ear: Secondary | ICD-10-CM

## 2019-03-06 ENCOUNTER — Other Ambulatory Visit: Payer: Self-pay

## 2019-03-06 ENCOUNTER — Ambulatory Visit
Admission: RE | Admit: 2019-03-06 | Discharge: 2019-03-06 | Disposition: A | Discharge status: Home / Self Care | Payer: Self-pay | Source: Ambulatory Visit | Attending: Otolaryngology | Admitting: Otolaryngology

## 2019-03-06 DIAGNOSIS — H9120 Sudden idiopathic hearing loss, unspecified ear: Secondary | ICD-10-CM

## 2019-03-06 MED ORDER — GADOBENATE DIMEGLUMINE 529 MG/ML IV SOLN
20.0000 mL | Freq: Once | INTRAVENOUS | Status: AC | PRN
  Administered 2019-03-06: 15:00:00 20 mL via INTRAVENOUS

## 2019-05-23 DIAGNOSIS — F422 Mixed obsessional thoughts and acts: Secondary | ICD-10-CM | POA: Diagnosis not present

## 2019-05-23 DIAGNOSIS — F4010 Social phobia, unspecified: Secondary | ICD-10-CM | POA: Diagnosis not present

## 2019-05-28 DIAGNOSIS — F4010 Social phobia, unspecified: Secondary | ICD-10-CM | POA: Diagnosis not present

## 2019-05-28 DIAGNOSIS — F422 Mixed obsessional thoughts and acts: Secondary | ICD-10-CM | POA: Diagnosis not present

## 2019-06-06 DIAGNOSIS — F4010 Social phobia, unspecified: Secondary | ICD-10-CM | POA: Diagnosis not present

## 2019-06-06 DIAGNOSIS — F422 Mixed obsessional thoughts and acts: Secondary | ICD-10-CM | POA: Diagnosis not present

## 2019-06-12 DIAGNOSIS — F422 Mixed obsessional thoughts and acts: Secondary | ICD-10-CM | POA: Diagnosis not present

## 2019-06-12 DIAGNOSIS — F4010 Social phobia, unspecified: Secondary | ICD-10-CM | POA: Diagnosis not present

## 2019-06-20 DIAGNOSIS — F422 Mixed obsessional thoughts and acts: Secondary | ICD-10-CM | POA: Diagnosis not present

## 2019-06-20 DIAGNOSIS — F4010 Social phobia, unspecified: Secondary | ICD-10-CM | POA: Diagnosis not present

## 2019-06-26 DIAGNOSIS — F4010 Social phobia, unspecified: Secondary | ICD-10-CM | POA: Diagnosis not present

## 2019-06-26 DIAGNOSIS — F422 Mixed obsessional thoughts and acts: Secondary | ICD-10-CM | POA: Diagnosis not present

## 2019-07-03 DIAGNOSIS — F4010 Social phobia, unspecified: Secondary | ICD-10-CM | POA: Diagnosis not present

## 2019-07-03 DIAGNOSIS — F422 Mixed obsessional thoughts and acts: Secondary | ICD-10-CM | POA: Diagnosis not present

## 2019-07-08 DIAGNOSIS — F422 Mixed obsessional thoughts and acts: Secondary | ICD-10-CM | POA: Diagnosis not present

## 2019-07-08 DIAGNOSIS — F4010 Social phobia, unspecified: Secondary | ICD-10-CM | POA: Diagnosis not present

## 2019-07-10 DIAGNOSIS — F422 Mixed obsessional thoughts and acts: Secondary | ICD-10-CM | POA: Diagnosis not present

## 2019-07-10 DIAGNOSIS — F4010 Social phobia, unspecified: Secondary | ICD-10-CM | POA: Diagnosis not present

## 2019-07-17 DIAGNOSIS — F4010 Social phobia, unspecified: Secondary | ICD-10-CM | POA: Diagnosis not present

## 2019-07-17 DIAGNOSIS — F422 Mixed obsessional thoughts and acts: Secondary | ICD-10-CM | POA: Diagnosis not present

## 2019-07-25 DIAGNOSIS — F4010 Social phobia, unspecified: Secondary | ICD-10-CM | POA: Diagnosis not present

## 2019-07-25 DIAGNOSIS — F422 Mixed obsessional thoughts and acts: Secondary | ICD-10-CM | POA: Diagnosis not present

## 2019-08-01 DIAGNOSIS — F4010 Social phobia, unspecified: Secondary | ICD-10-CM | POA: Diagnosis not present

## 2019-08-01 DIAGNOSIS — F422 Mixed obsessional thoughts and acts: Secondary | ICD-10-CM | POA: Diagnosis not present

## 2019-08-04 DIAGNOSIS — H6123 Impacted cerumen, bilateral: Secondary | ICD-10-CM | POA: Diagnosis not present

## 2019-08-08 DIAGNOSIS — F422 Mixed obsessional thoughts and acts: Secondary | ICD-10-CM | POA: Diagnosis not present

## 2019-08-08 DIAGNOSIS — F4010 Social phobia, unspecified: Secondary | ICD-10-CM | POA: Diagnosis not present

## 2019-08-15 DIAGNOSIS — F422 Mixed obsessional thoughts and acts: Secondary | ICD-10-CM | POA: Diagnosis not present

## 2019-08-15 DIAGNOSIS — F4010 Social phobia, unspecified: Secondary | ICD-10-CM | POA: Diagnosis not present

## 2019-08-21 DIAGNOSIS — F422 Mixed obsessional thoughts and acts: Secondary | ICD-10-CM | POA: Diagnosis not present

## 2019-08-21 DIAGNOSIS — F4010 Social phobia, unspecified: Secondary | ICD-10-CM | POA: Diagnosis not present

## 2019-08-24 DIAGNOSIS — B356 Tinea cruris: Secondary | ICD-10-CM | POA: Diagnosis not present

## 2019-08-24 DIAGNOSIS — B360 Pityriasis versicolor: Secondary | ICD-10-CM | POA: Diagnosis not present

## 2019-08-24 DIAGNOSIS — L309 Dermatitis, unspecified: Secondary | ICD-10-CM | POA: Diagnosis not present

## 2019-08-28 DIAGNOSIS — F4010 Social phobia, unspecified: Secondary | ICD-10-CM | POA: Diagnosis not present

## 2019-08-28 DIAGNOSIS — F422 Mixed obsessional thoughts and acts: Secondary | ICD-10-CM | POA: Diagnosis not present

## 2019-09-04 DIAGNOSIS — F4010 Social phobia, unspecified: Secondary | ICD-10-CM | POA: Diagnosis not present

## 2019-09-04 DIAGNOSIS — F422 Mixed obsessional thoughts and acts: Secondary | ICD-10-CM | POA: Diagnosis not present

## 2019-09-11 DIAGNOSIS — F422 Mixed obsessional thoughts and acts: Secondary | ICD-10-CM | POA: Diagnosis not present

## 2019-09-11 DIAGNOSIS — F4010 Social phobia, unspecified: Secondary | ICD-10-CM | POA: Diagnosis not present

## 2019-09-25 DIAGNOSIS — F4010 Social phobia, unspecified: Secondary | ICD-10-CM | POA: Diagnosis not present

## 2019-09-25 DIAGNOSIS — F422 Mixed obsessional thoughts and acts: Secondary | ICD-10-CM | POA: Diagnosis not present

## 2019-10-03 DIAGNOSIS — F422 Mixed obsessional thoughts and acts: Secondary | ICD-10-CM | POA: Diagnosis not present

## 2019-10-03 DIAGNOSIS — F4010 Social phobia, unspecified: Secondary | ICD-10-CM | POA: Diagnosis not present

## 2019-10-18 DIAGNOSIS — F4010 Social phobia, unspecified: Secondary | ICD-10-CM | POA: Diagnosis not present

## 2019-10-18 DIAGNOSIS — F422 Mixed obsessional thoughts and acts: Secondary | ICD-10-CM | POA: Diagnosis not present

## 2019-10-24 DIAGNOSIS — F4010 Social phobia, unspecified: Secondary | ICD-10-CM | POA: Diagnosis not present

## 2019-10-24 DIAGNOSIS — F422 Mixed obsessional thoughts and acts: Secondary | ICD-10-CM | POA: Diagnosis not present

## 2019-10-30 DIAGNOSIS — F422 Mixed obsessional thoughts and acts: Secondary | ICD-10-CM | POA: Diagnosis not present

## 2019-10-30 DIAGNOSIS — F4010 Social phobia, unspecified: Secondary | ICD-10-CM | POA: Diagnosis not present

## 2019-11-03 DIAGNOSIS — H6690 Otitis media, unspecified, unspecified ear: Secondary | ICD-10-CM | POA: Diagnosis not present

## 2019-11-07 DIAGNOSIS — F4010 Social phobia, unspecified: Secondary | ICD-10-CM | POA: Diagnosis not present

## 2019-11-07 DIAGNOSIS — F422 Mixed obsessional thoughts and acts: Secondary | ICD-10-CM | POA: Diagnosis not present

## 2019-11-21 DIAGNOSIS — F422 Mixed obsessional thoughts and acts: Secondary | ICD-10-CM | POA: Diagnosis not present

## 2019-11-21 DIAGNOSIS — F4010 Social phobia, unspecified: Secondary | ICD-10-CM | POA: Diagnosis not present

## 2019-11-24 DIAGNOSIS — H9202 Otalgia, left ear: Secondary | ICD-10-CM | POA: Diagnosis not present

## 2019-11-24 DIAGNOSIS — H9041 Sensorineural hearing loss, unilateral, right ear, with unrestricted hearing on the contralateral side: Secondary | ICD-10-CM | POA: Diagnosis not present

## 2019-11-24 DIAGNOSIS — M2669 Other specified disorders of temporomandibular joint: Secondary | ICD-10-CM | POA: Insufficient documentation

## 2019-11-24 DIAGNOSIS — Z974 Presence of external hearing-aid: Secondary | ICD-10-CM | POA: Diagnosis not present

## 2019-11-24 DIAGNOSIS — H6123 Impacted cerumen, bilateral: Secondary | ICD-10-CM | POA: Diagnosis not present

## 2019-11-24 DIAGNOSIS — H903 Sensorineural hearing loss, bilateral: Secondary | ICD-10-CM | POA: Insufficient documentation

## 2019-11-28 DIAGNOSIS — F4010 Social phobia, unspecified: Secondary | ICD-10-CM | POA: Diagnosis not present

## 2019-11-28 DIAGNOSIS — F422 Mixed obsessional thoughts and acts: Secondary | ICD-10-CM | POA: Diagnosis not present

## 2019-12-05 DIAGNOSIS — F422 Mixed obsessional thoughts and acts: Secondary | ICD-10-CM | POA: Diagnosis not present

## 2019-12-05 DIAGNOSIS — F4010 Social phobia, unspecified: Secondary | ICD-10-CM | POA: Diagnosis not present

## 2020-01-09 DIAGNOSIS — F422 Mixed obsessional thoughts and acts: Secondary | ICD-10-CM | POA: Diagnosis not present

## 2020-01-09 DIAGNOSIS — F4010 Social phobia, unspecified: Secondary | ICD-10-CM | POA: Diagnosis not present

## 2020-01-16 DIAGNOSIS — F422 Mixed obsessional thoughts and acts: Secondary | ICD-10-CM | POA: Diagnosis not present

## 2020-01-16 DIAGNOSIS — F4010 Social phobia, unspecified: Secondary | ICD-10-CM | POA: Diagnosis not present

## 2020-02-02 DIAGNOSIS — H6123 Impacted cerumen, bilateral: Secondary | ICD-10-CM | POA: Diagnosis not present

## 2020-02-02 DIAGNOSIS — H9202 Otalgia, left ear: Secondary | ICD-10-CM | POA: Diagnosis not present

## 2020-02-13 DIAGNOSIS — F422 Mixed obsessional thoughts and acts: Secondary | ICD-10-CM | POA: Diagnosis not present

## 2020-02-13 DIAGNOSIS — F4010 Social phobia, unspecified: Secondary | ICD-10-CM | POA: Diagnosis not present

## 2020-02-27 DIAGNOSIS — F4010 Social phobia, unspecified: Secondary | ICD-10-CM | POA: Diagnosis not present

## 2020-02-27 DIAGNOSIS — F422 Mixed obsessional thoughts and acts: Secondary | ICD-10-CM | POA: Diagnosis not present

## 2020-03-26 DIAGNOSIS — F422 Mixed obsessional thoughts and acts: Secondary | ICD-10-CM | POA: Diagnosis not present

## 2020-03-26 DIAGNOSIS — F4010 Social phobia, unspecified: Secondary | ICD-10-CM | POA: Diagnosis not present

## 2020-04-02 DIAGNOSIS — F4010 Social phobia, unspecified: Secondary | ICD-10-CM | POA: Diagnosis not present

## 2020-04-02 DIAGNOSIS — F422 Mixed obsessional thoughts and acts: Secondary | ICD-10-CM | POA: Diagnosis not present

## 2020-04-23 DIAGNOSIS — F4010 Social phobia, unspecified: Secondary | ICD-10-CM | POA: Diagnosis not present

## 2020-04-23 DIAGNOSIS — F422 Mixed obsessional thoughts and acts: Secondary | ICD-10-CM | POA: Diagnosis not present

## 2020-05-07 DIAGNOSIS — F422 Mixed obsessional thoughts and acts: Secondary | ICD-10-CM | POA: Diagnosis not present

## 2020-05-07 DIAGNOSIS — F4010 Social phobia, unspecified: Secondary | ICD-10-CM | POA: Diagnosis not present

## 2020-05-24 ENCOUNTER — Encounter (INDEPENDENT_AMBULATORY_CARE_PROVIDER_SITE_OTHER): Payer: Self-pay

## 2020-05-28 DIAGNOSIS — F4010 Social phobia, unspecified: Secondary | ICD-10-CM | POA: Diagnosis not present

## 2020-05-28 DIAGNOSIS — F422 Mixed obsessional thoughts and acts: Secondary | ICD-10-CM | POA: Diagnosis not present

## 2020-06-07 ENCOUNTER — Encounter (INDEPENDENT_AMBULATORY_CARE_PROVIDER_SITE_OTHER): Payer: Self-pay | Admitting: Bariatrics

## 2020-06-07 ENCOUNTER — Other Ambulatory Visit: Payer: Self-pay

## 2020-06-07 ENCOUNTER — Ambulatory Visit (INDEPENDENT_AMBULATORY_CARE_PROVIDER_SITE_OTHER): Payer: BC Managed Care – PPO | Admitting: Bariatrics

## 2020-06-07 VITALS — BP 148/89 | HR 93 | Temp 98.4°F | Ht 70.0 in | Wt 237.0 lb

## 2020-06-07 DIAGNOSIS — E7800 Pure hypercholesterolemia, unspecified: Secondary | ICD-10-CM

## 2020-06-07 DIAGNOSIS — Z1331 Encounter for screening for depression: Secondary | ICD-10-CM | POA: Diagnosis not present

## 2020-06-07 DIAGNOSIS — R5383 Other fatigue: Secondary | ICD-10-CM | POA: Diagnosis not present

## 2020-06-07 DIAGNOSIS — E559 Vitamin D deficiency, unspecified: Secondary | ICD-10-CM | POA: Diagnosis not present

## 2020-06-07 DIAGNOSIS — E669 Obesity, unspecified: Secondary | ICD-10-CM

## 2020-06-07 DIAGNOSIS — Z9189 Other specified personal risk factors, not elsewhere classified: Secondary | ICD-10-CM | POA: Diagnosis not present

## 2020-06-07 DIAGNOSIS — R0602 Shortness of breath: Secondary | ICD-10-CM

## 2020-06-07 DIAGNOSIS — R7309 Other abnormal glucose: Secondary | ICD-10-CM

## 2020-06-07 DIAGNOSIS — Z6834 Body mass index (BMI) 34.0-34.9, adult: Secondary | ICD-10-CM

## 2020-06-07 DIAGNOSIS — Z0289 Encounter for other administrative examinations: Secondary | ICD-10-CM

## 2020-06-08 ENCOUNTER — Encounter (INDEPENDENT_AMBULATORY_CARE_PROVIDER_SITE_OTHER): Payer: Self-pay | Admitting: Bariatrics

## 2020-06-08 DIAGNOSIS — R7303 Prediabetes: Secondary | ICD-10-CM | POA: Insufficient documentation

## 2020-06-08 DIAGNOSIS — E6609 Other obesity due to excess calories: Secondary | ICD-10-CM | POA: Insufficient documentation

## 2020-06-08 DIAGNOSIS — E559 Vitamin D deficiency, unspecified: Secondary | ICD-10-CM | POA: Insufficient documentation

## 2020-06-08 LAB — COMPREHENSIVE METABOLIC PANEL
ALT: 35 IU/L (ref 0–44)
AST: 27 IU/L (ref 0–40)
Albumin/Globulin Ratio: 1.3 (ref 1.2–2.2)
Albumin: 4.2 g/dL (ref 4.0–5.0)
Alkaline Phosphatase: 79 IU/L (ref 44–121)
BUN/Creatinine Ratio: 14 (ref 9–20)
BUN: 13 mg/dL (ref 6–24)
Bilirubin Total: 1 mg/dL (ref 0.0–1.2)
CO2: 24 mmol/L (ref 20–29)
Calcium: 9.5 mg/dL (ref 8.7–10.2)
Chloride: 103 mmol/L (ref 96–106)
Creatinine, Ser: 0.9 mg/dL (ref 0.76–1.27)
GFR calc Af Amer: 116 mL/min/{1.73_m2} (ref >59)
GFR calc non Af Amer: 101 mL/min/{1.73_m2} (ref >59)
Globulin, Total: 3.2 g/dL (ref 1.5–4.5)
Glucose: 90 mg/dL (ref 65–99)
Potassium: 4.3 mmol/L (ref 3.5–5.2)
Sodium: 140 mmol/L (ref 134–144)
Total Protein: 7.4 g/dL (ref 6.0–8.5)

## 2020-06-08 LAB — T3: T3, Total: 159 ng/dL (ref 71–180)

## 2020-06-08 LAB — LIPID PANEL WITH LDL/HDL RATIO
Cholesterol, Total: 194 mg/dL (ref 100–199)
HDL: 45 mg/dL (ref >39)
LDL Chol Calc (NIH): 127 mg/dL — ABNORMAL HIGH (ref 0–99)
LDL/HDL Ratio: 2.8 ratio (ref 0.0–3.6)
Triglycerides: 120 mg/dL (ref 0–149)
VLDL Cholesterol Cal: 22 mg/dL (ref 5–40)

## 2020-06-08 LAB — TSH: TSH: 0.86 u[IU]/mL (ref 0.450–4.500)

## 2020-06-08 LAB — HEMOGLOBIN A1C
Est. average glucose Bld gHb Est-mCnc: 117 mg/dL
Hgb A1c MFr Bld: 5.7 % — ABNORMAL HIGH (ref 4.8–5.6)

## 2020-06-08 LAB — INSULIN, RANDOM: INSULIN: 15.8 u[IU]/mL (ref 2.6–24.9)

## 2020-06-08 LAB — VITAMIN D 25 HYDROXY (VIT D DEFICIENCY, FRACTURES): Vit D, 25-Hydroxy: 9.6 ng/mL — ABNORMAL LOW (ref 30.0–100.0)

## 2020-06-08 LAB — T4, FREE: Free T4: 1.31 ng/dL (ref 0.82–1.77)

## 2020-06-08 NOTE — Progress Notes (Signed)
Chief Complaint:   OBESITY Michael Vazquez (MR# [id number]) is a 50 y.o. male who presents for evaluation and treatment of obesity and related comorbidities. Current BMI is Body mass index is 34.01 kg/m. Michael Vazquez has been struggling with his weight for many years and has been unsuccessful in either losing weight, maintaining weight loss, or reaching his healthy weight goal.  Michael Vazquez is currently in the action stage of change and ready to dedicate time achieving and maintaining a healthier weight. Michael Vazquez is interested in becoming our patient and working on intensive lifestyle modifications including (but not limited to) diet and exercise for weight loss.  Michael Vazquez does like to cook and notes no obstacles.   He craves sweets.  He considers himself a picky eater.  He skips breakfast sometimes.  Michael Vazquez's habits were reviewed today and are as follows: His family eats meals together, he thinks his family will eat healthier with him, his desired weight loss is 65 pounds, he started gaining weight within the last 3-5 years, he is a picky eater and doesn't like to eat healthier foods, he craves sweets, he snacks frequently in the evenings, he skips breakfast sometimes, he frequently eats larger portions than normal and he struggles with emotional eating.  Depression Screen Michael Vazquez's Food and Mood (modified PHQ-9) score was 5.  Depression screen Michael Vazquez 2/9 06/07/2020  Decreased Interest 0  Down, Depressed, Hopeless 0  PHQ - 2 Score 0  Altered sleeping 1  Tired, decreased energy 3  Change in appetite 1  Feeling bad or failure about yourself  0  Trouble concentrating 0  Moving slowly or fidgety/restless 0  Suicidal thoughts 0  PHQ-9 Score 5  Difficult doing work/chores Not difficult at all   Subjective:   1. Other fatigue Michael Vazquez denies daytime somnolence and reports waking up still tired. Patent has a history of symptoms of morning fatigue, morning headache and snoring. Michael Vazquez generally gets  6 or 7 hours of sleep per night, and states that he has generally restful sleep. Snoring is present. Apneic episodes are not present. Epworth Sleepiness Score is 6.  2. SOB (shortness of breath) on exertion Michael Vazquez notes increasing shortness of breath with exercising and seems to be worsening over time with weight gain. He notes getting out of breath sooner with activity than he used to. This has gotten worse recently. Michael Vazquez denies shortness of breath at rest or orthopnea.  3. High cholesterol Michael Vazquez has high cholesterol and has been trying to improve his cholesterol levels with intensive lifestyle modification including a low saturated fat diet, exercise and weight loss. He denies any chest pain, claudication or myalgias.  Lab Results  Component Value Date   ALT 35 06/07/2020   AST 27 06/07/2020   ALKPHOS 79 06/07/2020   BILITOT 1.0 06/07/2020   Lab Results  Component Value Date   CHOL 194 06/07/2020   HDL 45 06/07/2020   LDLCALC 127 (H) 06/07/2020   TRIG 120 06/07/2020   4. Vitamin D deficiency He is currently taking no vitamin D supplement.  5. Elevated glucose Michael Vazquez endorses an increased appetite.   No family history of diabetes.   6. Depression screening Michael Vazquez was screened for depression as part of his new patient workup.  PHQ-9 is 5.  7. At risk for activity intolerance Michael Vazquez is at risk for activity intolerance due to fatigue and shortness of breath.  Assessment/Plan:   1. Other fatigue Michael Vazquez does feel that his weight is causing his energy  to be lower than it should be. Fatigue may be related to obesity, depression or many other causes. Labs will be ordered, and in the meanwhile, Michael Vazquez will focus on self care including making healthy food choices, increasing physical activity and focusing on stress reduction.  Gradually increase activities.  Will check thyroid panel today.  - EKG 12-Lead - Comprehensive metabolic panel - Hemoglobin A1c - Insulin, random - Lipid  Panel With LDL/HDL Ratio - T3 - T4, free - TSH - VITAMIN D 25 Hydroxy (Vit-D Deficiency, Fractures) - EKG 12-Lead  2. SOB (shortness of breath) on exertion Michael Vazquez does feel that he gets out of breath more easily that he used to when he exercises. Michael Vazquez shortness of breath appears to be obesity related and exercise induced. He has agreed to work on weight loss and gradually increase exercise to treat his exercise induced shortness of breath. Will continue to monitor closely.  - Lipid Panel With LDL/HDL Ratio  3. High cholesterol Cardiovascular risk and specific lipid/LDL goals reviewed.  We discussed several lifestyle modifications today and Michael Vazquez will continue to work on diet, exercise and weight loss efforts. Orders and follow up as documented in patient record.  Will check lipid panel and CMP today.  Counseling Intensive lifestyle modifications are the first line treatment for this issue. . Dietary changes: Increase soluble fiber. Decrease simple carbohydrates. . Exercise changes: Moderate to vigorous-intensity aerobic activity 150 minutes per week if tolerated. . Lipid-lowering medications: see documented in medical record.  - Comprehensive metabolic panel - Lipid Panel With LDL/HDL Ratio  4. Vitamin D deficiency Low Vitamin D level contributes to fatigue and are associated with obesity, breast, and colon cancer.  Will check vitamin D level today.  - VITAMIN D 25 Hydroxy (Vit-D Deficiency, Fractures)  5. Elevated glucose Will check A1c and insulin level today.  6. Depression screening Depression screening was negative today.  7. At risk for activity intolerance Michael Vazquez was given approximately 15 minutes of exercise intolerance counseling today. He is 50 y.o. male and has risk factors exercise intolerance including obesity. We discussed intensive lifestyle modifications today with an emphasis on specific weight loss instructions and strategies. Michael Vazquez will slowly increase  activity as tolerated.  Repetitive spaced learning was employed today to elicit superior memory formation and behavioral change.  8. Class 1 obesity with serious comorbidity and body mass index (BMI) of 34.0 to 34.9 in adult, unspecified obesity type  Michael Vazquez is currently in the action stage of change and his goal is to continue with weight loss efforts. I recommend Michael Vazquez begin the structured treatment plan as follows:  He has agreed to the Category 3 Plan.  He will work on meal planning, intentional eating, and decreasing portion sizes.  Exercise goals: No exercise has been prescribed at this time.   Behavioral modification strategies: increasing lean protein intake, decreasing simple carbohydrates, increasing vegetables, increasing water intake, decreasing eating out, no skipping meals, meal planning and cooking strategies, keeping healthy foods in the home and planning for success.  He was informed of the importance of frequent follow-up visits to maximize his success with intensive lifestyle modifications for his multiple health conditions. He was informed we would discuss his lab results at his next visit unless there is a critical issue that needs to be addressed sooner. Michael Vazquez agreed to keep his next visit at the agreed upon time to discuss these results.  Objective:   Blood pressure (!) 148/89, pulse 93, temperature 98.4 F (36.9 C), temperature source Oral,  height 5\' 10"  (1.778 m), weight 237 lb (107.5 kg), SpO2 94 %. Body mass index is 34.01 kg/m.  EKG: Normal sinus rhythm, rate 90 bpm.  Indirect Calorimeter completed today shows a VO2 of 332 and a REE of 2313.  His calculated basal metabolic rate is thus his basal metabolic rate is better than expected.  General: Cooperative, alert, well developed, in no acute distress. HEENT: Conjunctivae and lids unremarkable. Cardiovascular: Regular rhythm.  Lungs: Normal work of breathing. Neurologic: No focal deficits.   Lab  Results  Component Value Date   CREATININE 0.90 06/07/2020   BUN 13 06/07/2020   NA 140 06/07/2020   K 4.3 06/07/2020   CL 103 06/07/2020   CO2 24 06/07/2020   Lab Results  Component Value Date   ALT 35 06/07/2020   AST 27 06/07/2020   ALKPHOS 79 06/07/2020   BILITOT 1.0 06/07/2020   Lab Results  Component Value Date   HGBA1C 5.7 (H) 06/07/2020   Lab Results  Component Value Date   INSULIN WILL FOLLOW 06/07/2020   Lab Results  Component Value Date   TSH 0.860 06/07/2020   Lab Results  Component Value Date   CHOL 194 06/07/2020   HDL 45 06/07/2020   LDLCALC 127 (H) 06/07/2020   TRIG 120 06/07/2020   Lab Results  Component Value Date   HGB 15.3 06/22/2008   HCT 49.0 08/12/2007   Attestation Statements:   Reviewed by clinician on day of visit: allergies, medications, problem list, medical history, surgical history, family history, social history, and previous encounter notes.  I, 08/14/2007, CMA, am acting as Insurance claims handler for Energy manager, DO  I have reviewed the above documentation for accuracy and completeness, and I agree with the above. [employer], DO

## 2020-06-21 ENCOUNTER — Encounter (INDEPENDENT_AMBULATORY_CARE_PROVIDER_SITE_OTHER): Payer: Self-pay | Admitting: Bariatrics

## 2020-06-21 ENCOUNTER — Ambulatory Visit (INDEPENDENT_AMBULATORY_CARE_PROVIDER_SITE_OTHER): Payer: BC Managed Care – PPO | Admitting: Bariatrics

## 2020-06-21 ENCOUNTER — Other Ambulatory Visit: Payer: Self-pay

## 2020-06-21 VITALS — BP 123/81 | HR 73 | Temp 97.9°F | Ht 70.0 in | Wt 225.0 lb

## 2020-06-21 DIAGNOSIS — Z6832 Body mass index (BMI) 32.0-32.9, adult: Secondary | ICD-10-CM

## 2020-06-21 DIAGNOSIS — E559 Vitamin D deficiency, unspecified: Secondary | ICD-10-CM | POA: Diagnosis not present

## 2020-06-21 DIAGNOSIS — E669 Obesity, unspecified: Secondary | ICD-10-CM

## 2020-06-21 DIAGNOSIS — R7303 Prediabetes: Secondary | ICD-10-CM

## 2020-06-21 DIAGNOSIS — Z9189 Other specified personal risk factors, not elsewhere classified: Secondary | ICD-10-CM

## 2020-06-21 MED ORDER — VITAMIN D (ERGOCALCIFEROL) 1.25 MG (50000 UNIT) PO CAPS: 50000.0000 [IU] | ORAL_CAPSULE | ORAL | 0 refills | Status: DC

## 2020-06-22 NOTE — Progress Notes (Signed)
Chief Complaint:   OBESITY Michael Vazquez is here to discuss his progress with his obesity treatment plan along with follow-up of his obesity related diagnoses. Michael Vazquez is on the Category 3 Plan and states he is following his eating plan approximately 98-99% of the time. Michael Vazquez states he is doing 0 minutes 0 times per week.  Today's visit was #: 2 Starting weight: 237 lbs Starting date: 06/07/2020 Today's weight: 225 lbs Today's date: 06/21/2020 Total lbs lost to date: 12 lbs Total lbs lost since last in-office visit: 12 lbs  Interim History: Michael Vazquez is down 12 lbs since his last visit. The plan was not that difficult for him. He had minimal cravings.  Subjective:   1. Vitamin D deficiency Pt's Vit D resulted at 9.6. He is not on a Vit D supplement.   Ref. Range 06/07/2020 11:25  Vitamin D, 25-Hydroxy Latest Ref Range: 30.0 - 100.0 ng/mL 9.6 (L)   2. Pre-diabetes Pt's A1c is 5.7 and insulin level 15.8. He is not on medication.   Lab Results  Component Value Date   HGBA1C 5.7 (H) 06/07/2020   Lab Results  Component Value Date   INSULIN 15.8 06/07/2020    3. At risk for osteoporosis Michael Vazquez is at higher risk of osteopenia and osteoporosis due to Vitamin D deficiency.    Assessment/Plan:   1. Vitamin D deficiency Low Vitamin D level contributes to fatigue and are associated with obesity, breast, and colon cancer. He agrees to continue to take prescription Vitamin D @50 ,000 IU every week and will follow-up for routine testing of Vitamin D, at least 2-3 times per year to avoid over-replacement. Refill Vit D.  2. Pre-diabetes Michael Vazquez will continue to work on weight loss, exercise, and decreasing simple carbohydrates to help decrease the risk of diabetes. Decrease carbohydrates, increase activities, and continue to work on diet and exercise  3. At risk for osteoporosis Michael Vazquez was given approximately 15 minutes of osteoporosis prevention counseling today. Michael Vazquez is at risk for  osteopenia and osteoporosis due to his Vitamin D deficiency. He was encouraged to take his Vitamin D and follow his higher calcium diet and increase strengthening exercise to help strengthen his bones and decrease his risk of osteopenia and osteoporosis.  Repetitive spaced learning was employed today to elicit superior memory formation and behavioral change.  4. Class 1 obesity with serious comorbidity and body mass index (BMI) of 32.0 to 32.9 in adult, unspecified obesity type Michael Vazquez is currently in the action stage of change. As such, his goal is to continue with weight loss efforts. He has agreed to the Category 3 Plan.   Pt will work on planning and intentional eating. Reviewed labs from 06/07/2020: CMP, lipid panel, Vit D, insulin, A1c, and thyroid panel. Reviewed with pt [employer].  Exercise goals: As is  Behavioral modification strategies: increasing lean protein intake, decreasing simple carbohydrates, increasing vegetables, increasing water intake, decreasing eating out, no skipping meals, meal planning and cooking strategies, keeping healthy foods in the home and planning for success.  Michael Vazquez has agreed to follow-up with our clinic in 2 weeks. He was informed of the importance of frequent follow-up visits to maximize his success with intensive lifestyle modifications for his multiple health conditions.   Objective:   Blood pressure 123/81, pulse 73, temperature 97.9 F (36.6 C), height 5\' 10"  (1.778 m), weight 225 lb (102.1 kg), SpO2 95 %. Body mass index is 32.28 kg/m.  General: Cooperative, alert, well developed, in no acute distress. HEENT:  Conjunctivae and lids unremarkable. Cardiovascular: Regular rhythm.  Lungs: Normal work of breathing. Neurologic: No focal deficits.   Lab Results  Component Value Date   CREATININE 0.90 06/07/2020   BUN 13 06/07/2020   NA 140 06/07/2020   K 4.3 06/07/2020   CL 103 06/07/2020   CO2 24 06/07/2020   Lab Results  Component Value  Date   ALT 35 06/07/2020   AST 27 06/07/2020   ALKPHOS 79 06/07/2020   BILITOT 1.0 06/07/2020   Lab Results  Component Value Date   HGBA1C 5.7 (H) 06/07/2020   Lab Results  Component Value Date   INSULIN 15.8 06/07/2020   Lab Results  Component Value Date   TSH 0.860 06/07/2020   Lab Results  Component Value Date   CHOL 194 06/07/2020   HDL 45 06/07/2020   LDLCALC 127 (H) 06/07/2020   TRIG 120 06/07/2020   Lab Results  Component Value Date   HGB 15.3 06/22/2008   HCT 49.0 08/12/2007     Attestation Statements:   Reviewed by clinician on day of visit: allergies, medications, problem list, medical history, surgical history, family history, social history, and previous encounter notes.  Edmund Hilda, am acting as Energy manager for [employer], DO.  I have reviewed the above documentation for accuracy and completeness, and I agree with the above. Corinna Capra, DO

## 2020-06-26 ENCOUNTER — Encounter (INDEPENDENT_AMBULATORY_CARE_PROVIDER_SITE_OTHER): Payer: Self-pay | Admitting: Bariatrics

## 2020-07-04 ENCOUNTER — Ambulatory Visit (INDEPENDENT_AMBULATORY_CARE_PROVIDER_SITE_OTHER): Payer: BC Managed Care – PPO | Admitting: Bariatrics

## 2020-07-04 ENCOUNTER — Encounter (INDEPENDENT_AMBULATORY_CARE_PROVIDER_SITE_OTHER): Payer: Self-pay | Admitting: Bariatrics

## 2020-07-04 ENCOUNTER — Other Ambulatory Visit: Payer: Self-pay

## 2020-07-04 VITALS — BP 125/79 | HR 80 | Temp 98.3°F | Ht 70.0 in | Wt 221.0 lb

## 2020-07-04 DIAGNOSIS — Z9189 Other specified personal risk factors, not elsewhere classified: Secondary | ICD-10-CM | POA: Diagnosis not present

## 2020-07-04 DIAGNOSIS — E7800 Pure hypercholesterolemia, unspecified: Secondary | ICD-10-CM | POA: Diagnosis not present

## 2020-07-04 DIAGNOSIS — E559 Vitamin D deficiency, unspecified: Secondary | ICD-10-CM

## 2020-07-04 DIAGNOSIS — E6609 Other obesity due to excess calories: Secondary | ICD-10-CM

## 2020-07-04 DIAGNOSIS — Z6831 Body mass index (BMI) 31.0-31.9, adult: Secondary | ICD-10-CM

## 2020-07-04 IMAGING — MR MR BRAIN/IAC WO/W CM
11 of 12 series · 45 of 48 positions shown · IV contrast (multihance)
Comparison: Head CT 12/19/2005.

CLINICAL DATA: 48-year-old male with right side hearing loss after
Max Brogan of shingles involving the right face.

EXAM:
MRI HEAD WITHOUT AND WITH CONTRAST
TECHNIQUE: Multiplanar, multiecho pulse sequences of the brain and surrounding
structures were obtained without and with intravenous contrast.
CONTRAST:  20mL MULTIHANCE GADOBENATE DIMEGLUMINE 529 MG/ML IV SOLN

[Series 5: T1 · sagittal · 4.0mm · 0.72mm/px · 4 of 27 slices shown (1 of 3)]
[im 1/27]
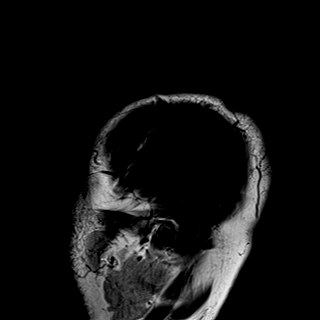
[im 9/27]
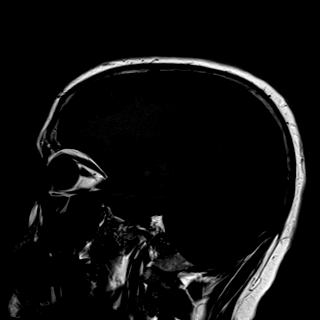
[im 18/27]
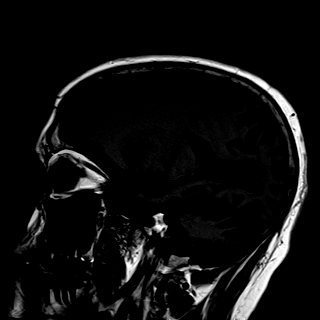
[im 27/27]
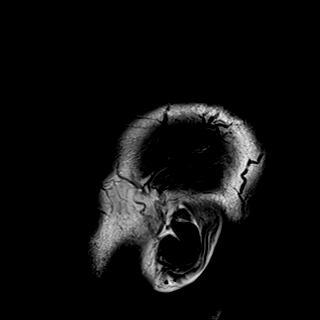

[Series 6: T2 · axial · 4.0mm · 0.36mm/px · z∈[-90,+50]mm · 3 of 28 slices shown]
[im 1/28]
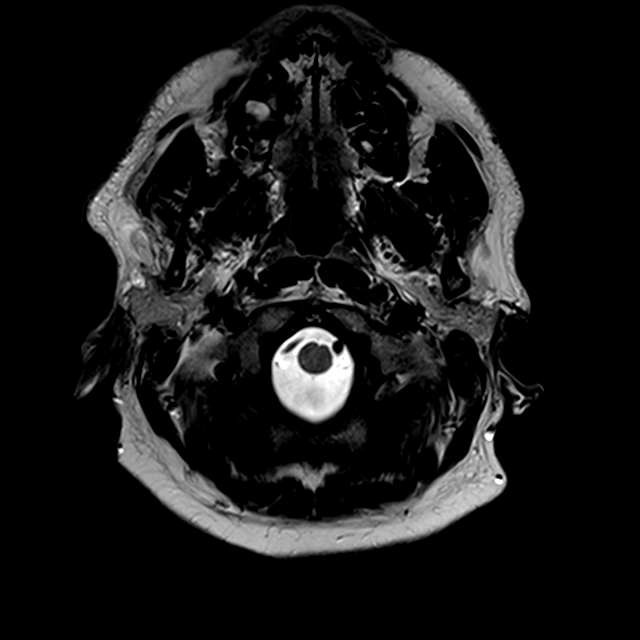
[im 14/28]
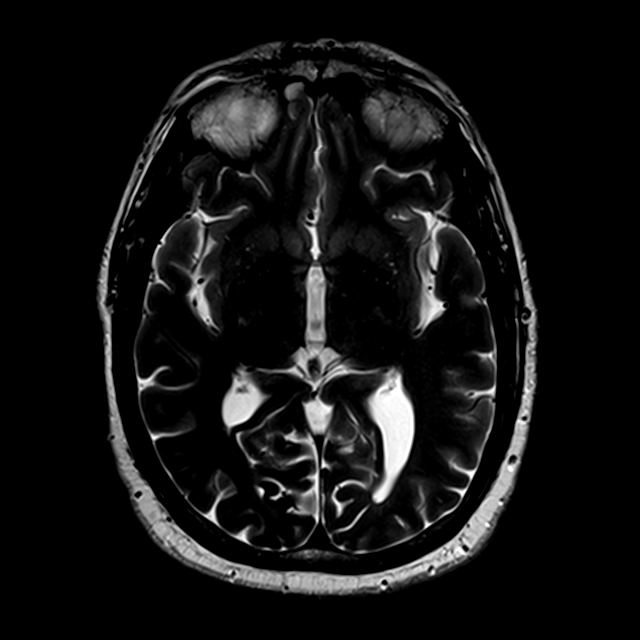
[im 28/28]
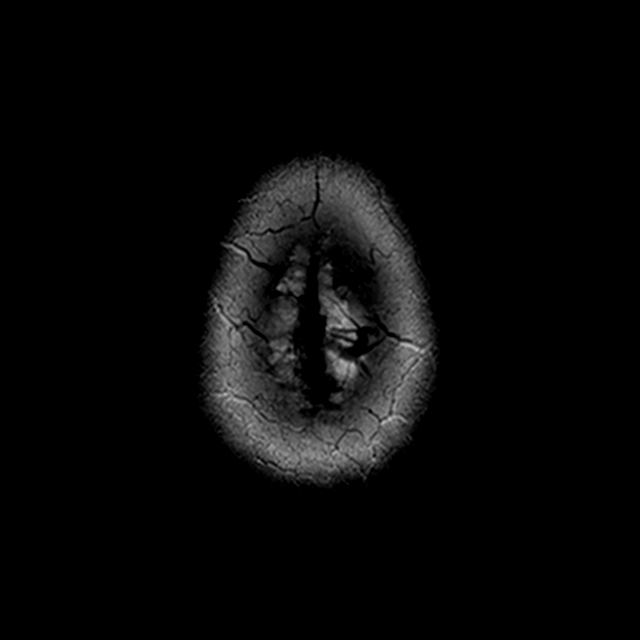

[Series 7: DWI · axial · 3.0mm · 1.44mm/px · z∈[-94,+47]mm · 8 of 88 slices shown (1 of 2)]
[im 1/88]
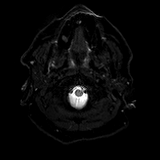
[im 13/88]
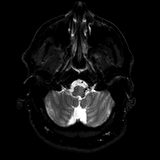
[im 25/88]
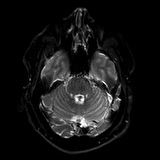
[im 38/88]
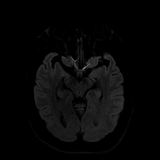
[im 50/88]
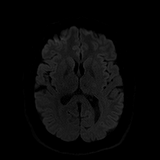
[im 63/88]
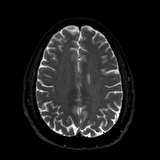
[im 75/88]
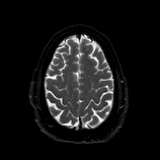
[im 88/88]
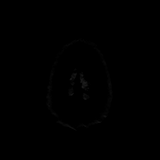

[Series 8: DWI · axial · 3.0mm · 1.44mm/px · z∈[-94,+47]mm · 4 of 44 slices shown (2 of 2)]
[im 1/44]
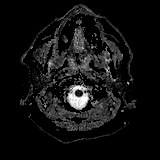
[im 15/44]
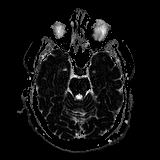
[im 29/44]
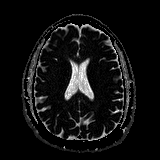
[im 44/44]
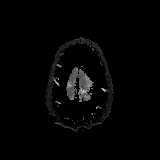

[Series 10: swi_images · axial · 3.0mm · 0.90mm/px · z∈[-97,+55]mm · 5 of 52 slices shown]
[im 1/52]
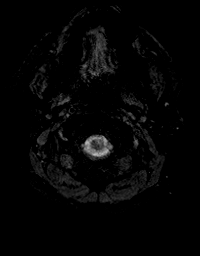
[im 13/52]
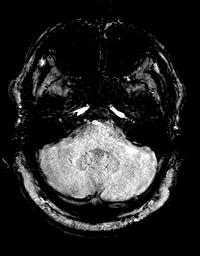
[im 26/52]
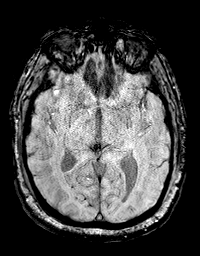
[im 39/52]
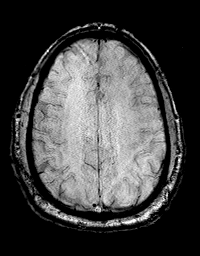
[im 52/52]
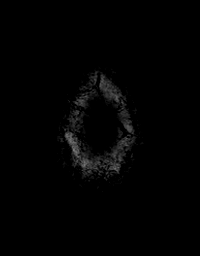

[Series 11: T1 · coronal · 2.5mm · 0.56mm/px · 1 of 13 slices shown (2 of 3)]
[im 1/13]
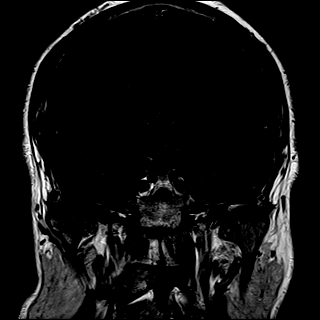

[Series 12: FLAIR · axial · 3.0mm · 0.72mm/px · z∈[-92,+52]mm · 3 of 30 slices shown]
[im 1/30]
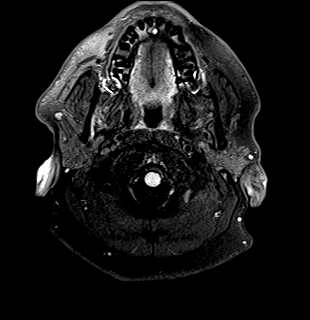
[im 15/30]
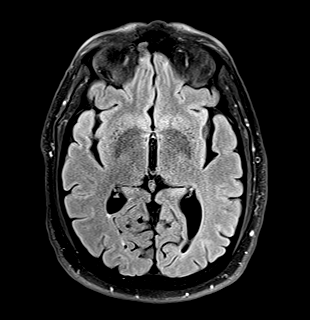
[im 30/30]
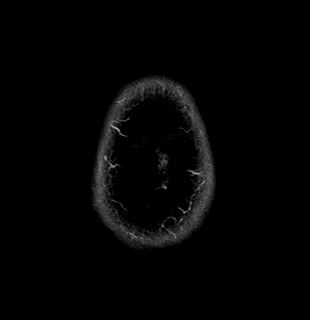

[Series 13: T1 · axial · 2.5mm · 0.50mm/px · 1 of 13 slices shown (3 of 3)]
[im 1/13]
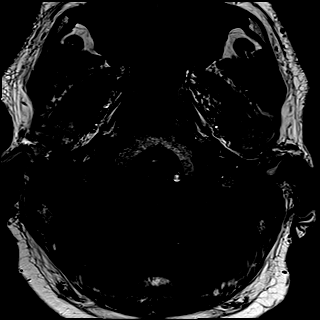

[Series 15: T1 post-contrast · coronal · 2.5mm · 0.56mm/px · 1 of 13 slices shown (1 of 3)]
[im 1/13]
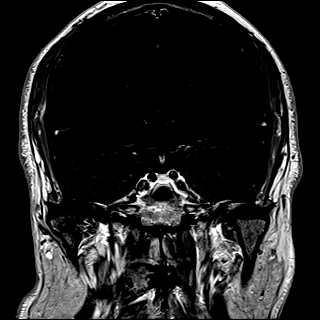

[Series 16: T1 post-contrast · axial · 2.5mm · 0.50mm/px · 1 of 13 slices shown (2 of 3)]
[im 1/13]
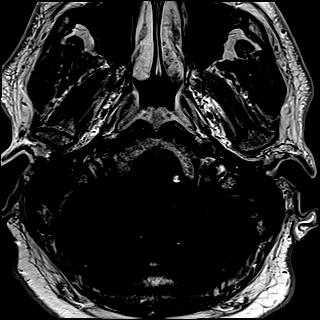

[Series 17: T1 post-contrast · axial · 1.0mm · 0.90mm/px · z∈[-90,+52]mm · 14 of 144 slices shown (3 of 3)]
[im 1/144]
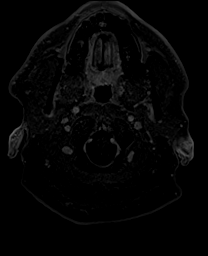
[im 12/144]
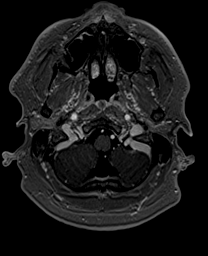
[im 23/144]
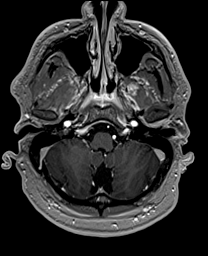
[im 34/144]
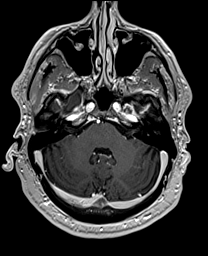
[im 45/144]
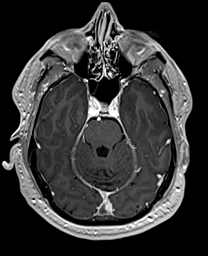
[im 56/144]
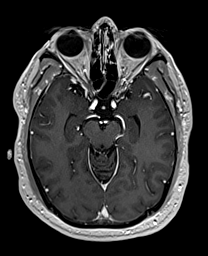
[im 67/144]
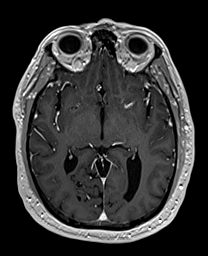
[im 78/144]
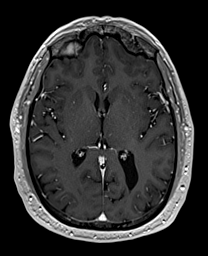
[im 89/144]
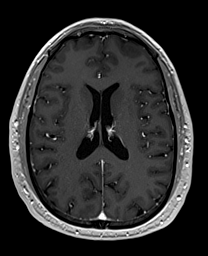
[im 100/144]
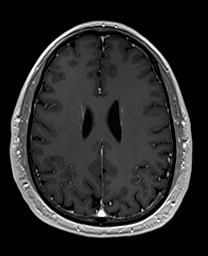
[im 111/144]
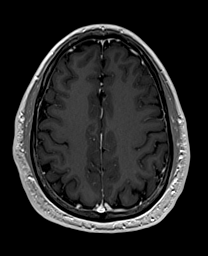
[im 122/144]
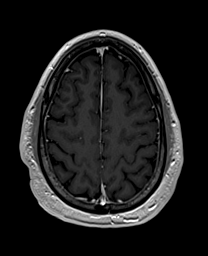
[im 133/144]
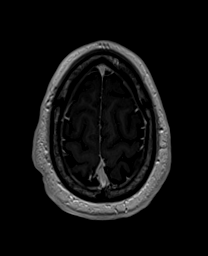
[im 144/144]
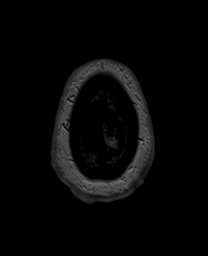

[45 of 48 positions shown; findings below may reference images not displayed]

FINDINGS: Brain: Cerebral volume appears not significantly changed since 8229.
Chronic asymmetry of the occipital horns appears to be normal
anatomic variation.

No restricted diffusion to suggest acute infarction. No midline
shift, mass effect, evidence of mass lesion, ventriculomegaly,
extra-axial collection or acute intracranial hemorrhage.
Cervicomedullary junction and pituitary are within normal limits.

Gray and white matter signal is within normal limits throughout the
brain. No encephalomalacia or chronic cerebral blood products
identified.

No abnormal enhancement identified.

No dural thickening.

Vascular: Major intracranial vascular flow voids are preserved. The
left vertebral artery appears dominant.

Skull and upper cervical spine: Negative visible cervical spine.
Normal bone marrow signal.

Sinuses/Orbits: Leftward gaze but otherwise negative orbits. Mild
bilateral paranasal sinus mucosal thickening.

Other: Dedicated internal auditory imaging. Normal cerebellopontine
angles. Normal bilateral cisternal and intracanalicular 7th and 8th
cranial nerve segments. Symmetric T2 signal in the cochlea and
vestibular structures. Mastoids are clear. No abnormal enhancement
identified. Stylomastoid foramina appear normal. No skull base
abnormality identified. Visible parotid glands appear negative.
IMPRESSION: Negative internal auditory imaging and MRI appearance of the brain
is within normal limits.

## 2020-07-04 MED ORDER — VITAMIN D (ERGOCALCIFEROL) 1.25 MG (50000 UNIT) PO CAPS: 50000.0000 [IU] | ORAL_CAPSULE | ORAL | 0 refills | Status: DC

## 2020-07-05 NOTE — Progress Notes (Signed)
Chief Complaint:   OBESITY Michael Vazquez is here to discuss his progress with his obesity treatment plan along with follow-up of his obesity related diagnoses. Michael Vazquez is on the Category 3 Plan and states he is following his eating plan approximately 98-99% of the time. Michael Vazquez states he is doing 0 minutes 0 times per week.  Today's visit was #: 3 Starting weight: 237 lbs Starting date: 06/07/2020 Today's weight: 221 lbs Today's date: 07/04/2020 Total lbs lost to date: 16 lbs Total lbs lost since last in-office visit: 4 lbs  Interim History: Michael Vazquez is down an additional 4 lbs and doing very well. He denies any struggles. He states that he has enough variety.  Subjective:   1. Vitamin D deficiency Michael Vazquez last Vit D level was 9.6. He has minimal sun exposure.   Ref. Range 06/07/2020 11:25  Vitamin D, 25-Hydroxy Latest Ref Range: 30.0 - 100.0 ng/mL 9.6 (L)   2. High cholesterol Michael Vazquez had an elevated LDL of 127 on 06/07/2020. He is not on statin therapy.  Lab Results  Component Value Date   ALT 35 06/07/2020   AST 27 06/07/2020   ALKPHOS 79 06/07/2020   BILITOT 1.0 06/07/2020   Lab Results  Component Value Date   CHOL 194 06/07/2020   HDL 45 06/07/2020   LDLCALC 127 (H) 06/07/2020   TRIG 120 06/07/2020    3.  At risk for heart disease Michael Vazquez is at a higher than average risk for cardiovascular disease due to obesity and hyperlipidemia.  Assessment/Plan:   1. Vitamin D deficiency Low Vitamin D level contributes to fatigue and are associated with obesity, breast, and colon cancer. He agrees to continue to take prescription Vitamin D @50 ,000 IU every week and will follow-up for routine testing of Vitamin D, at least 2-3 times per year to avoid over-replacement. Refill Vit D 50,000 units.  2. High cholesterol Cardiovascular risk and specific lipid/LDL goals reviewed.  We discussed several lifestyle modifications today and Michael Vazquez will continue to work on diet, exercise and  weight loss efforts. Orders and follow up as documented in patient record.   Counseling Intensive lifestyle modifications are the first line treatment for this issue. . Dietary changes: Increase soluble fiber. Decrease simple carbohydrates. . Exercise changes: Moderate to vigorous-intensity aerobic activity 150 minutes per week if tolerated. . Lipid-lowering medications: see documented in medical record.  3. At risk for heart disease  Michael Vazquez was given approximately 15 minutes of coronary artery disease prevention counseling today. He is 50 y.o. male and has risk factors for heart disease including obesity. We discussed intensive lifestyle modifications today with an emphasis on specific weight loss instructions and strategies.   Repetitive spaced learning was employed today to elicit superior memory formation and behavioral change.  4. Class 1 obesity due to excess calories without serious comorbidity with body mass index (BMI) of 31.0 to 31.9 in adult Michael Vazquez is currently in the action stage of change. As such, his goal is to continue with weight loss efforts. He has agreed to the Category 3 Plan.   Michael Vazquez will focus on meal plan and intentional eating. Increase water intake. Breakdown for calories and protein.  Exercise goals: Walk on days off.  Behavioral modification strategies: increasing lean protein intake, decreasing simple carbohydrates, increasing vegetables, increasing water intake, decreasing eating out, no skipping meals, meal planning and cooking strategies, keeping healthy foods in the home and planning for success.  Michael Vazquez has agreed to follow-up with our clinic in 2 weeks.  He was informed of the importance of frequent follow-up visits to maximize his success with intensive lifestyle modifications for his multiple health conditions.   Objective:   Blood pressure 125/79, pulse 80, temperature 98.3 F (36.8 C), height 5\' 10"  (1.778 m), weight 221 lb (100.2 kg), SpO2 95  %. Body mass index is 31.71 kg/m.  General: Cooperative, alert, well developed, in no acute distress. HEENT: Conjunctivae and lids unremarkable. Cardiovascular: Regular rhythm.  Lungs: Normal work of breathing. Neurologic: No focal deficits.   Lab Results  Component Value Date   CREATININE 0.90 06/07/2020   BUN 13 06/07/2020   NA 140 06/07/2020   K 4.3 06/07/2020   CL 103 06/07/2020   CO2 24 06/07/2020   Lab Results  Component Value Date   ALT 35 06/07/2020   AST 27 06/07/2020   ALKPHOS 79 06/07/2020   BILITOT 1.0 06/07/2020   Lab Results  Component Value Date   HGBA1C 5.7 (H) 06/07/2020   Lab Results  Component Value Date   INSULIN 15.8 06/07/2020   Lab Results  Component Value Date   TSH 0.860 06/07/2020   Lab Results  Component Value Date   CHOL 194 06/07/2020   HDL 45 06/07/2020   LDLCALC 127 (H) 06/07/2020   TRIG 120 06/07/2020   Lab Results  Component Value Date   HGB 15.3 06/22/2008   HCT 49.0 08/12/2007    Attestation Statements:   Reviewed by clinician on day of visit: allergies, medications, problem list, medical history, surgical history, family history, social history, and previous encounter notes.  08/14/2007, am acting as Edmund Hilda for Energy manager, DO.  I have reviewed the above documentation for accuracy and completeness, and I agree with the above. [employer], DO

## 2020-07-06 ENCOUNTER — Other Ambulatory Visit (INDEPENDENT_AMBULATORY_CARE_PROVIDER_SITE_OTHER): Payer: Self-pay

## 2020-07-06 ENCOUNTER — Other Ambulatory Visit (INDEPENDENT_AMBULATORY_CARE_PROVIDER_SITE_OTHER): Payer: Self-pay | Admitting: Bariatrics

## 2020-07-06 DIAGNOSIS — E559 Vitamin D deficiency, unspecified: Secondary | ICD-10-CM

## 2020-07-06 MED ORDER — VITAMIN D (ERGOCALCIFEROL) 1.25 MG (50000 UNIT) PO CAPS: 50000.0000 [IU] | ORAL_CAPSULE | ORAL | 0 refills | Status: DC

## 2020-07-09 ENCOUNTER — Encounter (INDEPENDENT_AMBULATORY_CARE_PROVIDER_SITE_OTHER): Payer: Self-pay | Admitting: Bariatrics

## 2020-07-16 DIAGNOSIS — F4010 Social phobia, unspecified: Secondary | ICD-10-CM | POA: Diagnosis not present

## 2020-07-16 DIAGNOSIS — F422 Mixed obsessional thoughts and acts: Secondary | ICD-10-CM | POA: Diagnosis not present

## 2020-07-19 ENCOUNTER — Ambulatory Visit (INDEPENDENT_AMBULATORY_CARE_PROVIDER_SITE_OTHER): Payer: BC Managed Care – PPO | Admitting: Bariatrics

## 2020-07-19 ENCOUNTER — Encounter (INDEPENDENT_AMBULATORY_CARE_PROVIDER_SITE_OTHER): Payer: Self-pay | Admitting: Bariatrics

## 2020-07-19 ENCOUNTER — Other Ambulatory Visit: Payer: Self-pay

## 2020-07-19 VITALS — BP 122/80 | HR 75 | Temp 98.0°F | Ht 70.0 in | Wt 215.0 lb

## 2020-07-19 DIAGNOSIS — R7303 Prediabetes: Secondary | ICD-10-CM | POA: Diagnosis not present

## 2020-07-19 DIAGNOSIS — Z9189 Other specified personal risk factors, not elsewhere classified: Secondary | ICD-10-CM

## 2020-07-19 DIAGNOSIS — E669 Obesity, unspecified: Secondary | ICD-10-CM

## 2020-07-19 DIAGNOSIS — Z6830 Body mass index (BMI) 30.0-30.9, adult: Secondary | ICD-10-CM | POA: Diagnosis not present

## 2020-07-19 DIAGNOSIS — E559 Vitamin D deficiency, unspecified: Secondary | ICD-10-CM | POA: Diagnosis not present

## 2020-07-19 MED ORDER — VITAMIN D (ERGOCALCIFEROL) 1.25 MG (50000 UNIT) PO CAPS: 50000.0000 [IU] | ORAL_CAPSULE | ORAL | 0 refills | Status: DC

## 2020-07-20 ENCOUNTER — Encounter (INDEPENDENT_AMBULATORY_CARE_PROVIDER_SITE_OTHER): Payer: Self-pay | Admitting: Bariatrics

## 2020-07-20 NOTE — Progress Notes (Signed)
Chief Complaint:   OBESITY Michael Vazquez is here to discuss his progress with his obesity treatment plan along with follow-up of his obesity related diagnoses. Lekeith is on the Category 3 Plan and states he is following his eating plan approximately 98% of the time. Rojelio states he is doing 0 minutes 0 times per week.  Today's visit was #: 4 Starting weight: 237 lbs Starting date: 06/07/2020 Today's weight: 215 lbs Today's date: 07/19/2020 Total lbs lost to date: 22 lbs Total lbs lost since last in-office visit: 6 lbs  Interim History: Aziel is down an additional 6 lbs and has done well overall. He is doing well with protein and water intake.   Subjective:   1. Vitamin D deficiency Matias's Vitamin D level was 9.2 on 06/07/2020. He is currently taking prescription vitamin D 50,000 IU each week. He reports limited sun exposure.   Ref. Range 06/07/2020 11:25  Vitamin D, 25-Hydroxy Latest Ref Range: 30.0 - 100.0 ng/mL 9.6 (L)   2. Pre-diabetes Braxton is not on medication for disease progression.  Lab Results  Component Value Date   HGBA1C 5.7 (H) 06/07/2020   Lab Results  Component Value Date   INSULIN 15.8 06/07/2020    3. At risk for activity intolerance Lum is at risk for exercise intolerance due to obesity.  Assessment/Plan:   1. Vitamin D deficiency Low Vitamin D level contributes to fatigue and are associated with obesity, breast, and colon cancer. He agrees to continue to take prescription Vitamin D @50 ,000 IU every week and will follow-up for routine testing of Vitamin D, at least 2-3 times per year to avoid over-replacement.  - Vitamin D, Ergocalciferol, (DRISDOL) 1.25 MG (50000 UNIT) CAPS capsule; Take 1 capsule (50,000 Units total) by mouth every 7 (seven) days.  Dispense: 4 capsule; Refill: 0  2. Pre-diabetes Arrow will continue to work on weight loss, exercise, and decreasing simple carbohydrates to help decrease the risk of diabetes.   3. At risk for  activity intolerance Benjimen was given approximately 15 minutes of exercise intolerance counseling today. He is 50 y.o. male and has risk factors exercise intolerance including obesity. We discussed intensive lifestyle modifications today with an emphasis on specific weight loss instructions and strategies. Ottis will slowly increase activity as tolerated.  Repetitive spaced learning was employed today to elicit superior memory formation and behavioral change.  4. Class 1 obesity with serious comorbidity and body mass index (BMI) of 30.0 to 30.9 in adult, unspecified obesity type Eashan is currently in the action stage of change. As such, his goal is to continue with weight loss efforts. He has agreed to the Category 3 Plan.   Exercise goals: Walk around the neighborhood 3 times a week.  Behavioral modification strategies: increasing lean protein intake, decreasing simple carbohydrates, increasing vegetables, increasing water intake, decreasing eating out, no skipping meals, meal planning and cooking strategies, keeping healthy foods in the home, ways to avoid boredom eating and planning for success.  Crixus has agreed to follow-up with our clinic in 2 weeks. He was informed of the importance of frequent follow-up visits to maximize his success with intensive lifestyle modifications for his multiple health conditions.   Objective:   Blood pressure 122/80, pulse 75, temperature 98 F (36.7 C), height 5\' 10"  (1.778 m), weight 215 lb (97.5 kg), SpO2 96 %. Body mass index is 30.85 kg/m.  General: Cooperative, alert, well developed, in no acute distress. HEENT: Conjunctivae and lids unremarkable. Cardiovascular: Regular rhythm.  Lungs:  Normal work of breathing. Neurologic: No focal deficits.   Lab Results  Component Value Date   CREATININE 0.90 06/07/2020   BUN 13 06/07/2020   NA 140 06/07/2020   K 4.3 06/07/2020   CL 103 06/07/2020   CO2 24 06/07/2020   Lab Results  Component Value  Date   ALT 35 06/07/2020   AST 27 06/07/2020   ALKPHOS 79 06/07/2020   BILITOT 1.0 06/07/2020   Lab Results  Component Value Date   HGBA1C 5.7 (H) 06/07/2020   Lab Results  Component Value Date   INSULIN 15.8 06/07/2020   Lab Results  Component Value Date   TSH 0.860 06/07/2020   Lab Results  Component Value Date   CHOL 194 06/07/2020   HDL 45 06/07/2020   LDLCALC 127 (H) 06/07/2020   TRIG 120 06/07/2020   Lab Results  Component Value Date   HGB 15.3 06/22/2008   HCT 49.0 08/12/2007    Attestation Statements:   Reviewed by clinician on day of visit: allergies, medications, problem list, medical history, surgical history, family history, social history, and previous encounter notes.  Edmund Hilda, am acting as Energy manager for [employer], DO.  I have reviewed the above documentation for accuracy and completeness, and I agree with the above. Corinna Capra, DO

## 2020-07-29 ENCOUNTER — Other Ambulatory Visit (INDEPENDENT_AMBULATORY_CARE_PROVIDER_SITE_OTHER): Payer: Self-pay | Admitting: Bariatrics

## 2020-07-29 DIAGNOSIS — E559 Vitamin D deficiency, unspecified: Secondary | ICD-10-CM

## 2020-08-01 ENCOUNTER — Other Ambulatory Visit (INDEPENDENT_AMBULATORY_CARE_PROVIDER_SITE_OTHER): Payer: Self-pay | Admitting: Bariatrics

## 2020-08-01 DIAGNOSIS — E559 Vitamin D deficiency, unspecified: Secondary | ICD-10-CM

## 2020-08-01 NOTE — Telephone Encounter (Signed)
Last seen by Dr. Brown. 

## 2020-08-02 ENCOUNTER — Ambulatory Visit (INDEPENDENT_AMBULATORY_CARE_PROVIDER_SITE_OTHER): Payer: BC Managed Care – PPO | Admitting: Bariatrics

## 2020-08-02 ENCOUNTER — Other Ambulatory Visit: Payer: Self-pay

## 2020-08-02 ENCOUNTER — Encounter (INDEPENDENT_AMBULATORY_CARE_PROVIDER_SITE_OTHER): Payer: Self-pay | Admitting: Bariatrics

## 2020-08-02 VITALS — BP 119/78 | HR 86 | Temp 98.5°F | Ht 70.0 in | Wt 211.0 lb

## 2020-08-02 DIAGNOSIS — R7303 Prediabetes: Secondary | ICD-10-CM

## 2020-08-02 DIAGNOSIS — E669 Obesity, unspecified: Secondary | ICD-10-CM

## 2020-08-02 DIAGNOSIS — Z6830 Body mass index (BMI) 30.0-30.9, adult: Secondary | ICD-10-CM

## 2020-08-02 DIAGNOSIS — E559 Vitamin D deficiency, unspecified: Secondary | ICD-10-CM | POA: Diagnosis not present

## 2020-08-03 ENCOUNTER — Encounter (INDEPENDENT_AMBULATORY_CARE_PROVIDER_SITE_OTHER): Payer: Self-pay | Admitting: Bariatrics

## 2020-08-03 NOTE — Progress Notes (Signed)
Chief Complaint:   OBESITY Michael Vazquez is here to discuss his progress with his obesity treatment plan along with follow-up of his obesity related diagnoses. Michael Vazquez is on the Category 3 Plan and states he is following his eating plan approximately 98% of the time. Michael Vazquez states he is doing 0 minutes 0 times per week.  Today's visit was #: 5 Starting weight: 237 lbs Starting date: 06/07/2020 Today's weight: 211 lbs Today's date: 08/02/2020 Total lbs lost to date: 26 lbs Total lbs lost since last in-office visit: 4 lbs  Interim History: Michael Vazquez is down an additional 4 lbs and has done well thus fair. He is doing well.  Subjective:   1. Pre-diabetes Michael Vazquez is not on medication.   Lab Results  Component Value Date   HGBA1C 5.7 (H) 06/07/2020   Lab Results  Component Value Date   INSULIN 15.8 06/07/2020    2. Vitamin D deficiency Michael Vazquez is taking Vit D.   Ref. Range 06/07/2020 11:25  Vitamin D, 25-Hydroxy Latest Ref Range: 30.0 - 100.0 ng/mL 9.6 (L)    Assessment/Plan:   1. Pre-diabetes Michael Vazquez will continue to work on weight loss, exercise, and decreasing simple carbohydrates to help decrease the risk of diabetes. Increase healthy proteins.  2. Vitamin D deficiency Low Vitamin D level contributes to fatigue and are associated with obesity, breast, and colon cancer. He agrees to continue to take prescription Vitamin D @50 ,000 IU every week and will follow-up for routine testing of Vitamin D, at least 2-3 times per year to avoid over-replacement.  3. Class 1 obesity with serious comorbidity and body mass index (BMI) of 30.0 to 30.9 in adult, unspecified obesity type Michael Vazquez is currently in the action stage of change. As such, his goal is to continue with weight loss efforts. He has agreed to the Category 3 Plan.   Meal plan Intentional eating Recipes II  Exercise goals: Walk in the neighborhood  Behavioral modification strategies: increasing lean protein intake, decreasing  simple carbohydrates, increasing vegetables, increasing water intake, decreasing eating out, no skipping meals, meal planning and cooking strategies, keeping healthy foods in the home and planning for success.  Michael Vazquez has agreed to follow-up with our clinic in 2 weeks. He was informed of the importance of frequent follow-up visits to maximize his success with intensive lifestyle modifications for his multiple health conditions.   Objective:   Blood pressure 119/78, pulse 86, temperature 98.5 F (36.9 C), height 5\' 10"  (1.778 m), weight 211 lb (95.7 kg), SpO2 98 %. Body mass index is 30.28 kg/m.  General: Cooperative, alert, well developed, in no acute distress. HEENT: Conjunctivae and lids unremarkable. Cardiovascular: Regular rhythm.  Lungs: Normal work of breathing. Neurologic: No focal deficits.   Lab Results  Component Value Date   CREATININE 0.90 06/07/2020   BUN 13 06/07/2020   NA 140 06/07/2020   K 4.3 06/07/2020   CL 103 06/07/2020   CO2 24 06/07/2020   Lab Results  Component Value Date   ALT 35 06/07/2020   AST 27 06/07/2020   ALKPHOS 79 06/07/2020   BILITOT 1.0 06/07/2020   Lab Results  Component Value Date   HGBA1C 5.7 (H) 06/07/2020   Lab Results  Component Value Date   INSULIN 15.8 06/07/2020   Lab Results  Component Value Date   TSH 0.860 06/07/2020   Lab Results  Component Value Date   CHOL 194 06/07/2020   HDL 45 06/07/2020   LDLCALC 127 (H) 06/07/2020   TRIG  120 06/07/2020   Lab Results  Component Value Date   HGB 15.3 06/22/2008   HCT 49.0 08/12/2007     Attestation Statements:   Reviewed by clinician on day of visit: allergies, medications, problem list, medical history, surgical history, family history, social history, and previous encounter notes.  Time spent on visit including pre-visit chart review and post-visit care and charting was 20 minutes.   Edmund Hilda, am acting as Energy manager for [employer], DO.  I have  reviewed the above documentation for accuracy and completeness, and I agree with the above. Corinna Capra, DO

## 2020-08-06 DIAGNOSIS — F422 Mixed obsessional thoughts and acts: Secondary | ICD-10-CM | POA: Diagnosis not present

## 2020-08-06 DIAGNOSIS — F4010 Social phobia, unspecified: Secondary | ICD-10-CM | POA: Diagnosis not present

## 2020-08-16 ENCOUNTER — Ambulatory Visit (INDEPENDENT_AMBULATORY_CARE_PROVIDER_SITE_OTHER): Payer: BC Managed Care – PPO | Admitting: Bariatrics

## 2020-08-20 DIAGNOSIS — F422 Mixed obsessional thoughts and acts: Secondary | ICD-10-CM | POA: Diagnosis not present

## 2020-08-20 DIAGNOSIS — F4010 Social phobia, unspecified: Secondary | ICD-10-CM | POA: Diagnosis not present

## 2020-08-30 ENCOUNTER — Ambulatory Visit (INDEPENDENT_AMBULATORY_CARE_PROVIDER_SITE_OTHER): Payer: BC Managed Care – PPO | Admitting: Bariatrics

## 2020-09-03 DIAGNOSIS — F422 Mixed obsessional thoughts and acts: Secondary | ICD-10-CM | POA: Diagnosis not present

## 2020-09-03 DIAGNOSIS — F4010 Social phobia, unspecified: Secondary | ICD-10-CM | POA: Diagnosis not present

## 2020-09-17 DIAGNOSIS — F422 Mixed obsessional thoughts and acts: Secondary | ICD-10-CM | POA: Diagnosis not present

## 2020-09-17 DIAGNOSIS — F4010 Social phobia, unspecified: Secondary | ICD-10-CM | POA: Diagnosis not present

## 2020-09-20 ENCOUNTER — Ambulatory Visit (INDEPENDENT_AMBULATORY_CARE_PROVIDER_SITE_OTHER): Payer: BC Managed Care – PPO | Admitting: Bariatrics

## 2020-10-04 ENCOUNTER — Ambulatory Visit (INDEPENDENT_AMBULATORY_CARE_PROVIDER_SITE_OTHER): Payer: BC Managed Care – PPO | Admitting: Bariatrics

## 2020-10-15 DIAGNOSIS — F422 Mixed obsessional thoughts and acts: Secondary | ICD-10-CM | POA: Diagnosis not present

## 2020-10-15 DIAGNOSIS — F4010 Social phobia, unspecified: Secondary | ICD-10-CM | POA: Diagnosis not present

## 2020-10-22 DIAGNOSIS — F422 Mixed obsessional thoughts and acts: Secondary | ICD-10-CM | POA: Diagnosis not present

## 2020-10-22 DIAGNOSIS — F4010 Social phobia, unspecified: Secondary | ICD-10-CM | POA: Diagnosis not present

## 2020-10-25 ENCOUNTER — Ambulatory Visit (INDEPENDENT_AMBULATORY_CARE_PROVIDER_SITE_OTHER): Payer: BC Managed Care – PPO | Admitting: Bariatrics

## 2020-11-05 DIAGNOSIS — F4010 Social phobia, unspecified: Secondary | ICD-10-CM | POA: Diagnosis not present

## 2020-11-05 DIAGNOSIS — F422 Mixed obsessional thoughts and acts: Secondary | ICD-10-CM | POA: Diagnosis not present

## 2020-11-22 ENCOUNTER — Encounter (INDEPENDENT_AMBULATORY_CARE_PROVIDER_SITE_OTHER): Payer: Self-pay | Admitting: Bariatrics

## 2020-11-22 ENCOUNTER — Ambulatory Visit (INDEPENDENT_AMBULATORY_CARE_PROVIDER_SITE_OTHER): Payer: BC Managed Care – PPO | Admitting: Bariatrics

## 2020-11-22 ENCOUNTER — Other Ambulatory Visit: Payer: Self-pay

## 2020-11-22 VITALS — BP 134/73 | HR 73 | Temp 98.0°F | Ht 70.0 in | Wt 202.0 lb

## 2020-11-22 DIAGNOSIS — E669 Obesity, unspecified: Secondary | ICD-10-CM

## 2020-11-22 DIAGNOSIS — R7303 Prediabetes: Secondary | ICD-10-CM | POA: Diagnosis not present

## 2020-11-22 DIAGNOSIS — Z9189 Other specified personal risk factors, not elsewhere classified: Secondary | ICD-10-CM

## 2020-11-22 DIAGNOSIS — E559 Vitamin D deficiency, unspecified: Secondary | ICD-10-CM | POA: Diagnosis not present

## 2020-11-22 DIAGNOSIS — Z6834 Body mass index (BMI) 34.0-34.9, adult: Secondary | ICD-10-CM | POA: Diagnosis not present

## 2020-11-22 MED ORDER — VITAMIN D (ERGOCALCIFEROL) 1.25 MG (50000 UNIT) PO CAPS
50000.0000 [IU] | ORAL_CAPSULE | ORAL | 0 refills | Status: DC
Start: 2020-11-22 — End: 2021-03-06

## 2020-11-27 NOTE — Progress Notes (Signed)
Chief Complaint:   OBESITY Michael Vazquez is here to discuss his progress with his obesity treatment plan along with follow-up of his obesity related diagnoses. Michael Vazquez is on the Category 3 Plan and states he is following his eating plan approximately 70-80% of the time. Michael Vazquez states he is doing 0 minutes 0 times per week.  Today's visit was #: 6 Starting weight: 237 lbs Starting date: 06/07/2020 Today's weight: 202 lbs Today's date: 11/22/2020 Total lbs lost to date: 35 lbs Total lbs lost since last in-office visit: 9 lbs  Interim History: Michael Vazquez is down an additional 9 lbs and doing very well overall.  He is getting in adequate amounts of water.  Subjective:   1. Vitamin D deficiency Michael Vazquez is taking Vitamin D.  2. Prediabetes Michael Vazquez is not on any medications.  3. At risk for osteoporosis Michael Vazquez is at higher risk of osteopenia and osteoporosis due to Vitamin D deficiency.    Assessment/Plan:   1. Vitamin D deficiency Low Vitamin D level contributes to fatigue and are associated with obesity, breast, and colon cancer. We will refill prescription Vitamin D for 1 month. Michael Vazquez will follow-up for routine testing of Vitamin D, at least 2-3 times per year to avoid over-replacement.  - Vitamin D, Ergocalciferol, (DRISDOL) 1.25 MG (50000 UNIT) CAPS capsule; Take 1 capsule (50,000 Units total) by mouth every 7 (seven) days.  Dispense: 4 capsule; Refill: 0  2. Prediabetes Michael Vazquez will continue to work on weight loss, exercise, and decreasing simple carbohydrates to help decrease the risk of diabetes (sweets and starchy). He will increase activities.  3. At risk for osteoporosis Michael Vazquez was given approximately 15 minutes of osteoporosis prevention counseling today. Michael Vazquez is at risk for osteopenia and osteoporosis due to his Vitamin D deficiency. He was encouraged to take his Vitamin D and follow his higher calcium diet and increase strengthening exercise to help strengthen his bones and  decrease his risk of osteopenia and osteoporosis.  Repetitive spaced learning was employed today to elicit superior memory formation and behavioral change.   4. Obesity, current BMI 29  Michael Vazquez is currently in the action stage of change. As such, his goal is to continue with weight loss efforts. He has agreed to the Category 3 Plan.   Michael Vazquez will continue meal planning. He is intentional eating.  Exercise goals:  Walking and discussed weights.  Behavioral modification strategies: increasing lean protein intake, decreasing simple carbohydrates, increasing vegetables, increasing water intake, decreasing eating out, no skipping meals, meal planning and cooking strategies, keeping healthy foods in the home, and planning for success.  Michael Vazquez has agreed to follow-up with our clinic in 2-3 weeks(fasting). He was informed of the importance of frequent follow-up visits to maximize his success with intensive lifestyle modifications for his multiple health conditions.   Objective:   Blood pressure 134/73, pulse 73, temperature 98 F (36.7 C), height 5\' 10"  (1.778 m), weight 202 lb (91.6 kg), SpO2 96 %. Body mass index is 28.98 kg/m.  General: Cooperative, alert, well developed, in no acute distress. HEENT: Conjunctivae and lids unremarkable. Cardiovascular: Regular rhythm.  Lungs: Normal work of breathing. Neurologic: No focal deficits.   Lab Results  Component Value Date   CREATININE 0.90 06/07/2020   BUN 13 06/07/2020   NA 140 06/07/2020   K 4.3 06/07/2020   CL 103 06/07/2020   CO2 24 06/07/2020   Lab Results  Component Value Date   ALT 35 06/07/2020   AST 27 06/07/2020   ALKPHOS  79 06/07/2020   BILITOT 1.0 06/07/2020   Lab Results  Component Value Date   HGBA1C 5.7 (H) 06/07/2020   Lab Results  Component Value Date   INSULIN 15.8 06/07/2020   Lab Results  Component Value Date   TSH 0.860 06/07/2020   Lab Results  Component Value Date   CHOL 194 06/07/2020   HDL 45  06/07/2020   LDLCALC 127 (H) 06/07/2020   TRIG 120 06/07/2020   Lab Results  Component Value Date   VD25OH 9.6 (L) 06/07/2020   Lab Results  Component Value Date   HGB 15.3 06/22/2008   HCT 49.0 08/12/2007   No results found for: IRON, TIBC, FERRITIN  Attestation Statements:   Reviewed by clinician on day of visit: allergies, medications, problem list, medical history, surgical history, family history, social history, and previous encounter notes.  I, Jackson Latino, RMA, am acting as Energy manager for [employer], DO.   I have reviewed the above documentation for accuracy and completeness, and I agree with the above. Corinna Capra, DO

## 2020-11-29 ENCOUNTER — Encounter (INDEPENDENT_AMBULATORY_CARE_PROVIDER_SITE_OTHER): Payer: Self-pay | Admitting: Bariatrics

## 2020-12-03 ENCOUNTER — Other Ambulatory Visit (INDEPENDENT_AMBULATORY_CARE_PROVIDER_SITE_OTHER): Payer: Self-pay | Admitting: Bariatrics

## 2020-12-03 DIAGNOSIS — E559 Vitamin D deficiency, unspecified: Secondary | ICD-10-CM

## 2020-12-10 DIAGNOSIS — F422 Mixed obsessional thoughts and acts: Secondary | ICD-10-CM | POA: Diagnosis not present

## 2020-12-10 DIAGNOSIS — F4010 Social phobia, unspecified: Secondary | ICD-10-CM | POA: Diagnosis not present

## 2020-12-13 ENCOUNTER — Ambulatory Visit (INDEPENDENT_AMBULATORY_CARE_PROVIDER_SITE_OTHER): Payer: BC Managed Care – PPO | Admitting: Bariatrics

## 2020-12-24 DIAGNOSIS — F4010 Social phobia, unspecified: Secondary | ICD-10-CM | POA: Diagnosis not present

## 2020-12-24 DIAGNOSIS — F422 Mixed obsessional thoughts and acts: Secondary | ICD-10-CM | POA: Diagnosis not present

## 2020-12-27 ENCOUNTER — Ambulatory Visit (INDEPENDENT_AMBULATORY_CARE_PROVIDER_SITE_OTHER): Payer: BC Managed Care – PPO | Admitting: Bariatrics

## 2020-12-27 DIAGNOSIS — R1030 Lower abdominal pain, unspecified: Secondary | ICD-10-CM | POA: Diagnosis not present

## 2021-01-10 ENCOUNTER — Ambulatory Visit (INDEPENDENT_AMBULATORY_CARE_PROVIDER_SITE_OTHER): Payer: BC Managed Care – PPO | Admitting: Bariatrics

## 2021-01-14 DIAGNOSIS — F422 Mixed obsessional thoughts and acts: Secondary | ICD-10-CM | POA: Diagnosis not present

## 2021-01-14 DIAGNOSIS — F4010 Social phobia, unspecified: Secondary | ICD-10-CM | POA: Diagnosis not present

## 2021-01-21 DIAGNOSIS — F422 Mixed obsessional thoughts and acts: Secondary | ICD-10-CM | POA: Diagnosis not present

## 2021-01-21 DIAGNOSIS — F4010 Social phobia, unspecified: Secondary | ICD-10-CM | POA: Diagnosis not present

## 2021-01-24 DIAGNOSIS — H6123 Impacted cerumen, bilateral: Secondary | ICD-10-CM | POA: Diagnosis not present

## 2021-01-31 ENCOUNTER — Ambulatory Visit (INDEPENDENT_AMBULATORY_CARE_PROVIDER_SITE_OTHER): Payer: BC Managed Care – PPO | Admitting: Bariatrics

## 2021-02-21 ENCOUNTER — Ambulatory Visit (INDEPENDENT_AMBULATORY_CARE_PROVIDER_SITE_OTHER): Payer: BC Managed Care – PPO | Admitting: Bariatrics

## 2021-02-24 DIAGNOSIS — F422 Mixed obsessional thoughts and acts: Secondary | ICD-10-CM | POA: Diagnosis not present

## 2021-02-24 DIAGNOSIS — F4010 Social phobia, unspecified: Secondary | ICD-10-CM | POA: Diagnosis not present

## 2021-03-06 ENCOUNTER — Encounter (INDEPENDENT_AMBULATORY_CARE_PROVIDER_SITE_OTHER): Payer: Self-pay | Admitting: Bariatrics

## 2021-03-06 ENCOUNTER — Other Ambulatory Visit: Payer: Self-pay

## 2021-03-06 ENCOUNTER — Ambulatory Visit (INDEPENDENT_AMBULATORY_CARE_PROVIDER_SITE_OTHER): Payer: BC Managed Care – PPO | Admitting: Bariatrics

## 2021-03-06 VITALS — BP 140/110 | HR 85 | Temp 98.1°F | Ht 70.0 in | Wt 209.0 lb

## 2021-03-06 DIAGNOSIS — R030 Elevated blood-pressure reading, without diagnosis of hypertension: Secondary | ICD-10-CM

## 2021-03-06 DIAGNOSIS — E559 Vitamin D deficiency, unspecified: Secondary | ICD-10-CM

## 2021-03-06 DIAGNOSIS — R7303 Prediabetes: Secondary | ICD-10-CM | POA: Diagnosis not present

## 2021-03-06 DIAGNOSIS — Z6834 Body mass index (BMI) 34.0-34.9, adult: Secondary | ICD-10-CM

## 2021-03-06 DIAGNOSIS — E669 Obesity, unspecified: Secondary | ICD-10-CM

## 2021-03-06 MED ORDER — VITAMIN D (ERGOCALCIFEROL) 1.25 MG (50000 UNIT) PO CAPS: 50000.0000 [IU] | ORAL_CAPSULE | ORAL | 0 refills | Status: DC

## 2021-03-06 NOTE — Progress Notes (Signed)
Chief Complaint:   OBESITY Michael Vazquez is here to discuss his progress with his obesity treatment plan along with follow-up of his obesity related diagnoses. Michael Vazquez is on the Category 3 Plan and states he is following his eating plan approximately 80% of the time. Michael Vazquez states he is walking when shopping for 30 minutes 2-3 times per week.  Today's visit was #: 7 Starting weight: 237 lbs Starting date: 06/07/2020 Today's weight: 209 lbs Today's date: 03/06/2021 Total lbs lost to date: 28 lbs Total lbs lost since last in-office visit: 0  Interim History: Michael Vazquez is up 7 lbs since his last visit. He states that he ate "stuff he should not have eaten". He has a difficult work schedule.   Subjective:   1. Vitamin D deficiency He is currently taking prescription vitamin D 50,000 IU each week. He denies nausea, vomiting or muscle weakness.  2. Prediabetes Michael Vazquez is not on medications for pre-diabetes currently.   3. Elevated blood pressure reading Michael Vazquez is taking Advil and Aleve for tooth pain. His blood pressure was 134/73.  Assessment/Plan:   1. Vitamin D deficiency Low Vitamin D level contributes to fatigue and are associated with obesity, breast, and colon cancer. We will refill prescription Vitamin D 50,000 IU every week for 1 month with no refills and Michael Vazquez will follow-up for routine testing of Vitamin D, at least 2-3 times per year to avoid over-replacement.  - Vitamin D, Ergocalciferol, (DRISDOL) 1.25 MG (50000 UNIT) CAPS capsule; Take 1 capsule (50,000 Units total) by mouth every 7 (seven) days.  Dispense: 4 capsule; Refill: 0  2. Prediabetes Michael Vazquez will continue to work on plan, exercise, and decreasing simple carbohydrates to help decrease the risk of diabetes.   3. Elevated blood pressure reading Michael Vazquez is working on healthy weight loss and exercise to improve blood pressure control. We will continue to monitor and will see dentist. We will watch for signs of hypotension  as he continues his lifestyle modifications.  4. Obesity, current BMI 30.0 Michael Vazquez is currently in the action stage of change. As such, his goal is to continue with weight loss efforts. He has agreed to the Category 3 Plan.   Michael Vazquez will continue meal planning and intentional eating. He will increase protein and decrease carbohydrates and eating out. He will increase water intake.   Exercise goals:  Michael Vazquez will start walking on his day off. He will use hand weights.   Behavioral modification strategies: increasing lean protein intake, decreasing simple carbohydrates, increasing vegetables, increasing water intake, decreasing eating out, no skipping meals, meal planning and cooking strategies, keeping healthy foods in the home, and planning for success.  Michael Vazquez has agreed to follow-up with our clinic in 3 weeks (fasting). He was informed of the importance of frequent follow-up visits to maximize his success with intensive lifestyle modifications for his multiple health conditions.   Objective:   Blood pressure (!) 140/110, pulse 85, temperature 98.1 F (36.7 C), height 5\' 10"  (1.778 m), weight 209 lb (94.8 kg), SpO2 96 %. Body mass index is 29.99 kg/m.  General: Cooperative, alert, well developed, in no acute distress. HEENT: Conjunctivae and lids unremarkable. Cardiovascular: Regular rhythm.  Lungs: Normal work of breathing. Neurologic: No focal deficits.   Lab Results  Component Value Date   CREATININE 0.90 06/07/2020   BUN 13 06/07/2020   NA 140 06/07/2020   K 4.3 06/07/2020   CL 103 06/07/2020   CO2 24 06/07/2020   Lab Results  Component Value Date  ALT 35 06/07/2020   AST 27 06/07/2020   ALKPHOS 79 06/07/2020   BILITOT 1.0 06/07/2020   Lab Results  Component Value Date   HGBA1C 5.7 (H) 06/07/2020   Lab Results  Component Value Date   INSULIN 15.8 06/07/2020   Lab Results  Component Value Date   TSH 0.860 06/07/2020   Lab Results  Component Value Date   CHOL  194 06/07/2020   HDL 45 06/07/2020   LDLCALC 127 (H) 06/07/2020   TRIG 120 06/07/2020   Lab Results  Component Value Date   VD25OH 9.6 (L) 06/07/2020   Lab Results  Component Value Date   HGB 15.3 06/22/2008   HCT 49.0 08/12/2007   No results found for: IRON, TIBC, FERRITIN  Attestation Statements:   Reviewed by clinician on day of visit: allergies, medications, problem list, medical history, surgical history, family history, social history, and previous encounter notes.   I, Jackson Latino, RMA, am acting as Energy manager for [employer], DO.   I have reviewed the above documentation for accuracy and completeness, and I agree with the above. Corinna Capra, DO'

## 2021-03-07 ENCOUNTER — Encounter (INDEPENDENT_AMBULATORY_CARE_PROVIDER_SITE_OTHER): Payer: Self-pay | Admitting: Bariatrics

## 2021-03-24 DIAGNOSIS — F4010 Social phobia, unspecified: Secondary | ICD-10-CM | POA: Diagnosis not present

## 2021-03-24 DIAGNOSIS — F422 Mixed obsessional thoughts and acts: Secondary | ICD-10-CM | POA: Diagnosis not present

## 2021-04-03 ENCOUNTER — Ambulatory Visit (INDEPENDENT_AMBULATORY_CARE_PROVIDER_SITE_OTHER): Payer: BC Managed Care – PPO | Admitting: Bariatrics

## 2021-04-04 ENCOUNTER — Other Ambulatory Visit (INDEPENDENT_AMBULATORY_CARE_PROVIDER_SITE_OTHER): Payer: Self-pay | Admitting: Bariatrics

## 2021-04-04 DIAGNOSIS — E559 Vitamin D deficiency, unspecified: Secondary | ICD-10-CM

## 2021-04-04 NOTE — Telephone Encounter (Signed)
LOV with Dr. Brown

## 2021-04-04 NOTE — Telephone Encounter (Signed)
LAST APPOINTMENT DATE: 04/03/21 NEXT APPOINTMENT DATE: 04/24/21   [id number] [place] Otto, [place] - [address] AT [facility] [address] [place] [place] [address] Phone: [phone] Fax: [phone]  Patient is requesting a refill of the following medications: Pending Prescriptions:                       Disp   Refills   Vitamin D, Ergocalciferol, (DRISDOL) 1.25 *4 caps*0       Sig: TAKE 1 CAPSULE BY MOUTH EVERY 7 DAYS   Date last filled: 03/06/21 Previously prescribed by Dr. Manson Passey  Lab Results      Component                Value               Date                      HGBA1C                   5.7 (H)             06/07/2020           Lab Results      Component                Value               Date                      LDLCALC                  127 (H)             06/07/2020                CREATININE               0.90                06/07/2020           Lab Results      Component                Value               Date                      VD25OH                   9.6 (L)             06/07/2020            BP Readings from Last 3 Encounters: 03/06/21 : (!) 140/110 11/22/20 : 134/73 08/02/20 : 119/78

## 2021-04-21 ENCOUNTER — Encounter (INDEPENDENT_AMBULATORY_CARE_PROVIDER_SITE_OTHER): Payer: Self-pay | Admitting: Bariatrics

## 2021-04-21 ENCOUNTER — Encounter (INDEPENDENT_AMBULATORY_CARE_PROVIDER_SITE_OTHER): Payer: Self-pay | Admitting: Family Medicine

## 2021-04-24 ENCOUNTER — Ambulatory Visit (INDEPENDENT_AMBULATORY_CARE_PROVIDER_SITE_OTHER): Payer: BC Managed Care – PPO | Admitting: Family Medicine

## 2021-05-12 DIAGNOSIS — F422 Mixed obsessional thoughts and acts: Secondary | ICD-10-CM | POA: Diagnosis not present

## 2021-05-12 DIAGNOSIS — F4010 Social phobia, unspecified: Secondary | ICD-10-CM | POA: Diagnosis not present

## 2021-05-26 DIAGNOSIS — F422 Mixed obsessional thoughts and acts: Secondary | ICD-10-CM | POA: Diagnosis not present

## 2021-05-26 DIAGNOSIS — F4010 Social phobia, unspecified: Secondary | ICD-10-CM | POA: Diagnosis not present

## 2021-06-04 ENCOUNTER — Other Ambulatory Visit (INDEPENDENT_AMBULATORY_CARE_PROVIDER_SITE_OTHER): Payer: Self-pay | Admitting: Bariatrics

## 2021-06-04 ENCOUNTER — Encounter (INDEPENDENT_AMBULATORY_CARE_PROVIDER_SITE_OTHER): Payer: Self-pay | Admitting: Bariatrics

## 2021-06-04 DIAGNOSIS — E559 Vitamin D deficiency, unspecified: Secondary | ICD-10-CM

## 2021-06-09 DIAGNOSIS — F4010 Social phobia, unspecified: Secondary | ICD-10-CM | POA: Diagnosis not present

## 2021-06-09 DIAGNOSIS — F422 Mixed obsessional thoughts and acts: Secondary | ICD-10-CM | POA: Diagnosis not present

## 2021-06-23 DIAGNOSIS — F4010 Social phobia, unspecified: Secondary | ICD-10-CM | POA: Diagnosis not present

## 2021-06-23 DIAGNOSIS — F422 Mixed obsessional thoughts and acts: Secondary | ICD-10-CM | POA: Diagnosis not present

## 2021-06-30 DIAGNOSIS — F4010 Social phobia, unspecified: Secondary | ICD-10-CM | POA: Diagnosis not present

## 2021-06-30 DIAGNOSIS — F422 Mixed obsessional thoughts and acts: Secondary | ICD-10-CM | POA: Diagnosis not present

## 2021-07-14 DIAGNOSIS — F422 Mixed obsessional thoughts and acts: Secondary | ICD-10-CM | POA: Diagnosis not present

## 2021-07-14 DIAGNOSIS — F4010 Social phobia, unspecified: Secondary | ICD-10-CM | POA: Diagnosis not present

## 2021-07-28 DIAGNOSIS — F4010 Social phobia, unspecified: Secondary | ICD-10-CM | POA: Diagnosis not present

## 2021-07-28 DIAGNOSIS — F422 Mixed obsessional thoughts and acts: Secondary | ICD-10-CM | POA: Diagnosis not present

## 2021-08-11 DIAGNOSIS — F4010 Social phobia, unspecified: Secondary | ICD-10-CM | POA: Diagnosis not present

## 2021-08-11 DIAGNOSIS — F422 Mixed obsessional thoughts and acts: Secondary | ICD-10-CM | POA: Diagnosis not present

## 2021-08-14 ENCOUNTER — Encounter (INDEPENDENT_AMBULATORY_CARE_PROVIDER_SITE_OTHER): Payer: Self-pay | Admitting: Nurse Practitioner

## 2021-08-14 ENCOUNTER — Other Ambulatory Visit: Payer: Self-pay

## 2021-08-14 ENCOUNTER — Ambulatory Visit (INDEPENDENT_AMBULATORY_CARE_PROVIDER_SITE_OTHER): Payer: BC Managed Care – PPO | Admitting: Nurse Practitioner

## 2021-08-14 VITALS — BP 138/94 | HR 77 | Temp 98.1°F | Ht 70.0 in | Wt 222.0 lb

## 2021-08-14 DIAGNOSIS — Z6831 Body mass index (BMI) 31.0-31.9, adult: Secondary | ICD-10-CM

## 2021-08-14 DIAGNOSIS — R030 Elevated blood-pressure reading, without diagnosis of hypertension: Secondary | ICD-10-CM | POA: Diagnosis not present

## 2021-08-14 DIAGNOSIS — R7303 Prediabetes: Secondary | ICD-10-CM

## 2021-08-14 DIAGNOSIS — E559 Vitamin D deficiency, unspecified: Secondary | ICD-10-CM

## 2021-08-14 DIAGNOSIS — E669 Obesity, unspecified: Secondary | ICD-10-CM

## 2021-08-14 DIAGNOSIS — Z9189 Other specified personal risk factors, not elsewhere classified: Secondary | ICD-10-CM

## 2021-08-14 DIAGNOSIS — E7800 Pure hypercholesterolemia, unspecified: Secondary | ICD-10-CM | POA: Diagnosis not present

## 2021-08-14 MED ORDER — VITAMIN D (ERGOCALCIFEROL) 1.25 MG (50000 UNIT) PO CAPS: 50000.0000 [IU] | ORAL_CAPSULE | ORAL | 0 refills | Status: DC

## 2021-08-15 DIAGNOSIS — I10 Essential (primary) hypertension: Secondary | ICD-10-CM | POA: Diagnosis not present

## 2021-08-15 NOTE — Progress Notes (Signed)
? ? ? ?Chief Complaint:  ? ?OBESITY ?Michael Vazquez is here to discuss his progress with his obesity treatment plan along with follow-up of his obesity related diagnoses. [name] is on the Category 3 Plan and states he is following his eating plan approximately 30% of the time. [name] states he is doing 0 minutes 0 times per week. ? ?Today's visit was #: 8 ?Starting weight: 237 lbs ?Starting date: 06/07/2020 ?Today's weight: 222 lbs ?Today's date: 08/14/2021 ?Total lbs lost to date: 15 lbs ?Total lbs lost since last in-office visit: 0 ? ?Interim History: [name] was seen here last on 03/06/2021. Since his last visit he went on a cruise and celebrated the holidays. He hasn't been making healthy choices. He drinks water and coffee daily. He notes hunger and cravings at night. ? ?Subjective:  ? ?1. Vitamin D deficiency ?[name]'s last Vitmain D was very low at 9.6. He is not taking any Vitamin D currently.  ? ?2. Prediabetes ?[name]'s last A1C was 5.7. He is not currently on medications.  ? ?3. Elevated blood pressure reading ?[name]'s family history: His father has a.fib. He denies chest pain, shortness on breath or palpitations. He has never been on medications in the past. He denies caffeine or over the counter medications today.  ? ?4. High cholesterol ?[name] is not on a statin currently.  ? ?5. At risk for impaired metabolic function ?[name] is at risk for impaired metabolic function due to not meeting protein goals.  ? ?Assessment/Plan:  ? ?1. Vitamin D deficiency ?Low Vitamin D level contributes to fatigue and are associated with obesity, breast, and colon cancer. We will refill prescription Vitamin D 50,000 IU every week for 1 month with 1 refills and [name] will follow-up for routine testing of Vitamin D, at least 2-3 times per year to avoid over-replacement. We discussed side effects. We will obtained labs at next office visit.  ? ?- Vitamin D, Ergocalciferol, (DRISDOL) 1.25 MG (50000 UNIT) CAPS capsule; Take 1 capsule  (50,000 Units total) by mouth every 7 (seven) days.  Dispense: 4 capsule; Refill: 0 ? ?2. Prediabetes ?[name] will continue to work on weight loss, exercise, and decreasing simple carbohydrates to help decrease the risk of diabetes. We will obtain labs at next office visit.  ? ?3. Elevated blood pressure reading ?[name] needs to follow up with his primary care physician. He is working on healthy weight loss and exercise to improve blood pressure control. We will watch for signs of hypotension as he continues his lifestyle modifications. ? ?4. High cholesterol ?Cardiovascular risk and specific lipid/LDL goals reviewed.  [name] is not fasting today. We will obtain labs at next office visit. We discussed several lifestyle modifications today and [name] will continue to work on diet, exercise and weight loss efforts. Orders and follow up as documented in patient record.  ? ?Counseling ?Intensive lifestyle modifications are the first line treatment for this issue. ?Dietary changes: Increase soluble fiber. Decrease simple carbohydrates. ?Exercise changes: Moderate to vigorous-intensity aerobic activity 150 minutes per week if tolerated. ?Lipid-lowering medications: see documented in medical record. ? ?5. At risk for impaired metabolic function ?[name] was given approximately 15 minutes of impaired  metabolic function prevention counseling today. We discussed intensive lifestyle modifications today with an emphasis on specific nutrition and exercise instructions and strategies.  ? ?Repetitive spaced learning was employed today to elicit superior memory formation and behavioral change.  ? ?6. Obesity, current BMI 31.9 ?[name] is currently in the action stage of change. As such, his  goal is to continue with weight loss efforts. He has agreed to the Category 3 Plan.  ? ?[name] was provided handouts on Cagegory 3 plan and Category 3 shopping list. ? ?Exercise goals:  [name] will start walking.  ? ?Behavioral modification  strategies: increasing lean protein intake, increasing water intake, no skipping meals, and meal planning and cooking strategies. ? ?[name] has agreed to follow-up with our clinic in 2 weeks (fasting). He was informed of the importance of frequent follow-up visits to maximize his success with intensive lifestyle modifications for his multiple health conditions.  ? ?Objective:  ? ?Blood pressure (!) 138/94, pulse 77, temperature 98.1 ?F (36.7 ?C), height 5\' 10"  (1.778 m), weight 222 lb (100.7 kg), SpO2 100 %. ?Body mass index is 31.85 kg/m?. ? ?General: Cooperative, alert, well developed, in no acute distress. ?HEENT: Conjunctivae and lids unremarkable. ?Cardiovascular: Regular rhythm.  ?Lungs: Normal work of breathing. ?Neurologic: No focal deficits.  ? ?Lab Results  ?Component Value Date  ? CREATININE 0.90 06/07/2020  ? BUN 13 06/07/2020  ? NA 140 06/07/2020  ? K 4.3 06/07/2020  ? CL 103 06/07/2020  ? CO2 24 06/07/2020  ? ?Lab Results  ?Component Value Date  ? ALT 35 06/07/2020  ? AST 27 06/07/2020  ? ALKPHOS 79 06/07/2020  ? BILITOT 1.0 06/07/2020  ? ?Lab Results  ?Component Value Date  ? HGBA1C 5.7 (H) 06/07/2020  ? ?Lab Results  ?Component Value Date  ? INSULIN 15.8 06/07/2020  ? ?Lab Results  ?Component Value Date  ? TSH 0.860 06/07/2020  ? ?Lab Results  ?Component Value Date  ? CHOL 194 06/07/2020  ? HDL 45 06/07/2020  ? LDLCALC 127 (H) 06/07/2020  ? TRIG 120 06/07/2020  ? ?Lab Results  ?Component Value Date  ? VD25OH 9.6 (L) 06/07/2020  ? ?Lab Results  ?Component Value Date  ? HGB 15.3 06/22/2008  ? HCT 49.0 08/12/2007  ? ?No results found for: IRON, TIBC, FERRITIN ? ?Attestation Statements:  ? ?Reviewed by clinician on day of visit: allergies, medications, problem list, medical history, surgical history, family history, social history, and previous encounter notes. ? ?I, Lizbeth Bark, RMA, am acting as Location manager for Everardo Pacific, FNP. ? ?I have reviewed the above documentation for accuracy and  completeness, and I agree with the above. Everardo Pacific, FNP  ?

## 2021-08-25 DIAGNOSIS — F422 Mixed obsessional thoughts and acts: Secondary | ICD-10-CM | POA: Diagnosis not present

## 2021-08-25 DIAGNOSIS — F4010 Social phobia, unspecified: Secondary | ICD-10-CM | POA: Diagnosis not present

## 2021-08-28 ENCOUNTER — Encounter (INDEPENDENT_AMBULATORY_CARE_PROVIDER_SITE_OTHER): Payer: Self-pay | Admitting: Nurse Practitioner

## 2021-08-28 ENCOUNTER — Ambulatory Visit (INDEPENDENT_AMBULATORY_CARE_PROVIDER_SITE_OTHER): Payer: BC Managed Care – PPO | Admitting: Nurse Practitioner

## 2021-08-28 VITALS — BP 118/90 | HR 77 | Temp 97.1°F | Ht 70.0 in | Wt 225.0 lb

## 2021-08-28 DIAGNOSIS — I10 Essential (primary) hypertension: Secondary | ICD-10-CM | POA: Diagnosis not present

## 2021-08-28 DIAGNOSIS — Z9189 Other specified personal risk factors, not elsewhere classified: Secondary | ICD-10-CM

## 2021-08-28 DIAGNOSIS — E669 Obesity, unspecified: Secondary | ICD-10-CM

## 2021-08-28 DIAGNOSIS — E559 Vitamin D deficiency, unspecified: Secondary | ICD-10-CM

## 2021-08-28 DIAGNOSIS — R7303 Prediabetes: Secondary | ICD-10-CM

## 2021-08-28 DIAGNOSIS — Z6832 Body mass index (BMI) 32.0-32.9, adult: Secondary | ICD-10-CM

## 2021-08-28 MED ORDER — VITAMIN D (ERGOCALCIFEROL) 1.25 MG (50000 UNIT) PO CAPS: 50000.0000 [IU] | ORAL_CAPSULE | ORAL | 0 refills | Status: DC

## 2021-09-03 NOTE — Progress Notes (Signed)
? ? ? ?Chief Complaint:  ? ?OBESITY ?Michael Vazquez is here to discuss his progress with his obesity treatment plan along with follow-up of his obesity related diagnoses. Michael Vazquez is on the Category 3 Plan and states he is following his eating plan approximately 0% of the time. Michael Vazquez states he is doing 0 minutes 0 times per week. ? ?Today's visit was #: 9 ?Starting weight: 237 lbs ?Starting date: 06/07/2020 ?Today's weight: 222 lbs ?Today's date: 08/28/2021 ?Total lbs lost to date: 15 lbs ?Total lbs lost since last in-office visit: 0 ? ?Interim History: Michael Vazquez celebrated his birthday and Michael Vazquez since his last visit. He is substituting a protein shake for breakfast 3 days per week. He has no vacations or celebrations for the next couple ot months. He is drinking coffee with splenda and cream and water daily.  ? ?Subjective:  ? ?1. Primary hypertension ?Michael Vazquez saw his primary care physician after his last visit with myself and was started on Lisinopril 10 mg . He denies side effects chest pains, shortness of breath or palpitations. He has a follow up with appointment with primary care physician on April 13th. ? ?2. Prediabetes ?Michael Vazquez's last A1C was 5.7 and insulin was 15.8. He has never been on medications.  ? ?3. Vitamin D deficiency ?Michael Vazquez's last Vitamin D was 9.6. He is taking Vitamin D 50,000 IU weekly. He denies side effects nausea, vomiting and muscle weakness.  ? ?4. At risk for heart disease ?Michael Vazquez is at risk for heart disease due to hypertension.  ? ?Assessment/Plan:  ? ?1. Primary hypertension ?Michael Vazquez will continue to follow up with his primary care physician. He will continue medications as directed. He is working on healthy weight loss and exercise to improve blood pressure control. We will watch for signs of hypotension as he continues his lifestyle modifications. ? ?2. Prediabetes ?Handouts: Insulin Resistance, pre-diabetes and Metformin was provided today. Michael Vazquez will continue to work on weight loss, exercise,  and decreasing simple carbohydrates to help decrease the risk of diabetes.  ? ?3. Vitamin D deficiency ?Low Vitamin D level contributes to fatigue and are associated with obesity, breast, and colon cancer. We will refill prescription Vitamin D 50,000 IU every week for 1 month with no refills and Michael Vazquez will follow-up for routine testing of Vitamin D, at least 2-3 times per year to avoid over-replacement. We discussed side effects today.  ? ?- Vitamin D, Ergocalciferol, (DRISDOL) 1.25 MG (50000 UNIT) CAPS capsule; Take 1 capsule (50,000 Units total) by mouth every 7 (seven) days.  Dispense: 4 capsule; Refill: 0 ? ?4. At risk for heart disease ?Michael Vazquez was given approximately 15 minutes of coronary artery disease prevention counseling today. He is 51 y.o. male and has risk factors for heart disease including obesity. We discussed intensive lifestyle modifications today with an emphasis on specific weight loss instructions and strategies. ? ?Repetitive spaced learning was employed today to elicit superior memory formation and behavioral change.   ? ?5. Obesity, current BMI 32.3 ?Michael Vazquez is currently in the action stage of change. As such, his goal is to continue with weight loss efforts. He has agreed to the Category 3 Plan.  ? ?Exercise goals:  Michael Vazquez will start walking. ? ?Behavioral modification strategies: increasing lean protein intake, increasing water intake, and no skipping meals. ? ?Michael Vazquez has agreed to follow-up with our clinic in 4 weeks. He was informed of the importance of frequent follow-up visits to maximize his success with intensive lifestyle modifications for his multiple health conditions.  ? ?  Objective:  ? ?Blood pressure 118/90, pulse 77, temperature (!) 97.1 ?F (36.2 ?C), height 5\' 10"  (1.778 m), weight 225 lb (102.1 kg), SpO2 98 %. ?Body mass index is 32.28 kg/m?. ? ?General: Cooperative, alert, well developed, in no acute distress. ?HEENT: Conjunctivae and lids unremarkable. ?Cardiovascular:  Regular rhythm.  ?Lungs: Normal work of breathing. ?Neurologic: No focal deficits.  ? ?Lab Results  ?Component Value Date  ? CREATININE 0.90 06/07/2020  ? BUN 13 06/07/2020  ? NA 140 06/07/2020  ? K 4.3 06/07/2020  ? CL 103 06/07/2020  ? CO2 24 06/07/2020  ? ?Lab Results  ?Component Value Date  ? ALT 35 06/07/2020  ? AST 27 06/07/2020  ? ALKPHOS 79 06/07/2020  ? BILITOT 1.0 06/07/2020  ? ?Lab Results  ?Component Value Date  ? HGBA1C 5.7 (H) 06/07/2020  ? ?Lab Results  ?Component Value Date  ? INSULIN 15.8 06/07/2020  ? ?Lab Results  ?Component Value Date  ? TSH 0.860 06/07/2020  ? ?Lab Results  ?Component Value Date  ? CHOL 194 06/07/2020  ? HDL 45 06/07/2020  ? LDLCALC 127 (H) 06/07/2020  ? TRIG 120 06/07/2020  ? ?Lab Results  ?Component Value Date  ? VD25OH 9.6 (L) 06/07/2020  ? ?Lab Results  ?Component Value Date  ? HGB 15.3 06/22/2008  ? HCT 49.0 08/12/2007  ? ?No results found for: IRON, TIBC, FERRITIN ? ?Attestation Statements:  ? ?Reviewed by clinician on day of visit: allergies, medications, problem list, medical history, surgical history, family history, social history, and previous encounter notes. ? ?I, 08/14/2007, RMA, am acting as Jackson Latino for Energy manager, FNP.  ? ?I have reviewed the above documentation for accuracy and completeness, and I agree with the above. Irene Limbo, FNP  ?

## 2021-09-04 DIAGNOSIS — I10 Essential (primary) hypertension: Secondary | ICD-10-CM | POA: Diagnosis not present

## 2021-09-15 DIAGNOSIS — F4010 Social phobia, unspecified: Secondary | ICD-10-CM | POA: Diagnosis not present

## 2021-09-15 DIAGNOSIS — F422 Mixed obsessional thoughts and acts: Secondary | ICD-10-CM | POA: Diagnosis not present

## 2021-09-25 ENCOUNTER — Ambulatory Visit (INDEPENDENT_AMBULATORY_CARE_PROVIDER_SITE_OTHER): Payer: BC Managed Care – PPO | Admitting: Nurse Practitioner

## 2021-09-25 ENCOUNTER — Encounter (INDEPENDENT_AMBULATORY_CARE_PROVIDER_SITE_OTHER): Payer: Self-pay | Admitting: Nurse Practitioner

## 2021-09-25 VITALS — BP 142/94 | HR 69 | Temp 97.8°F | Ht 70.0 in | Wt 223.0 lb

## 2021-09-25 DIAGNOSIS — Z6832 Body mass index (BMI) 32.0-32.9, adult: Secondary | ICD-10-CM | POA: Diagnosis not present

## 2021-09-25 DIAGNOSIS — E559 Vitamin D deficiency, unspecified: Secondary | ICD-10-CM

## 2021-09-25 DIAGNOSIS — Z6834 Body mass index (BMI) 34.0-34.9, adult: Secondary | ICD-10-CM

## 2021-09-25 DIAGNOSIS — I10 Essential (primary) hypertension: Secondary | ICD-10-CM

## 2021-09-25 DIAGNOSIS — E669 Obesity, unspecified: Secondary | ICD-10-CM | POA: Diagnosis not present

## 2021-09-25 MED ORDER — VITAMIN D (ERGOCALCIFEROL) 1.25 MG (50000 UNIT) PO CAPS: 50000.0000 [IU] | ORAL_CAPSULE | ORAL | 0 refills | Status: DC

## 2021-09-26 NOTE — Telephone Encounter (Signed)
Stephanie

## 2021-09-27 NOTE — Progress Notes (Signed)
? ? ? ?Chief Complaint:  ? ?OBESITY ?Michael Vazquez is here to discuss his progress with his obesity treatment plan along with follow-up of his obesity related diagnoses. Michael Vazquez is on the Category 3 Plan and states he is following his eating plan approximately 75% of the time. Michael Vazquez states he is yoga for 20 minutes 2 times per week. ? ?Today's visit was #: 10 ?Starting weight: 237 lbs ?Starting date: 06/07/2020 ?Today's weight: 223 lbs ?Today's date: 09/25/2021 ?Total lbs lost to date: 14 lbs ?Total lbs lost since last in-office visit: 2 lbs ? ?Interim History: Michael Vazquez has overall done well with weight loss. He is following Category 3 and doing well. He is eating oatmeal for breakfast. He is drinking 2 bottles of water daily. He is no longer substituting a protein shake for breakfast.  ? ?Subjective:  ? ?1. Primary hypertension ?Rakim's blood pressure is elevated today 142/94. Since his last visit his PCP increased his Lisinopril to 20mg .  Denies chest pain, shortness of breath or palpitations.   ? ?2. Vitamin D deficiency ?Michael Vazquez is taking Vitamin D 50,000 IU weekly. He denies side effects nausea, vomiting, and muscle weakness.  ? ?Assessment/Plan:  ? ?1. Primary hypertension ?Quantavis has a follow up with his primary care physician in June. He will continue to follow up with primary care physician. We will obtain labs at next office visit. He is working on healthy weight loss and exercise to improve blood pressure control. We will watch for signs of hypotension as he continues his lifestyle modifications. ? ?2. Vitamin D deficiency ?Low Vitamin D level contributes to fatigue and are associated with obesity, breast, and colon cancer. We will refill prescription Vitamin D 50,000 IU every week for 1 month with no refills and Maverik will follow-up for routine testing of Vitamin D, at least 2-3 times per year to avoid over-replacement. We will obtain labs at next week.  ? ?- Vitamin D, Ergocalciferol, (DRISDOL) 1.25 MG (50000  UNIT) CAPS capsule; Take 1 capsule (50,000 Units total) by mouth every 7 (seven) days.  Dispense: 4 capsule; Refill: 0 ? ?3. Obesity, current BMI 32.1 ?Michael Vazquez is currently in the action stage of change. As such, his goal is to continue with weight loss efforts. He has agreed to the Category 3 Plan.  ? ?Exercise goals:  As is.  ? ?Behavioral modification strategies: increasing lean protein intake, increasing water intake, no skipping meals, and planning for success. ? ?Michael Vazquez has agreed to follow-up with our clinic in 4 weeks. He was informed of the importance of frequent follow-up visits to maximize his success with intensive lifestyle modifications for his multiple health conditions.  ? ?Objective:  ? ?Blood pressure (!) 142/94, pulse 69, temperature 97.8 ?F (36.6 ?C), height 5\' 10"  (1.778 m), weight 223 lb (101.2 kg), SpO2 95 %. ?Body mass index is 32 kg/m?. ? ?General: Cooperative, alert, well developed, in no acute distress. ?HEENT: Conjunctivae and lids unremarkable. ?Cardiovascular: Regular rhythm.  ?Lungs: Normal work of breathing. ?Neurologic: No focal deficits.  ? ?Lab Results  ?Component Value Date  ? CREATININE 0.90 06/07/2020  ? BUN 13 06/07/2020  ? NA 140 06/07/2020  ? K 4.3 06/07/2020  ? CL 103 06/07/2020  ? CO2 24 06/07/2020  ? ?Lab Results  ?Component Value Date  ? ALT 35 06/07/2020  ? AST 27 06/07/2020  ? ALKPHOS 79 06/07/2020  ? BILITOT 1.0 06/07/2020  ? ?Lab Results  ?Component Value Date  ? HGBA1C 5.7 (H) 06/07/2020  ? ?Lab Results  ?  Component Value Date  ? INSULIN 15.8 06/07/2020  ? ?Lab Results  ?Component Value Date  ? TSH 0.860 06/07/2020  ? ?Lab Results  ?Component Value Date  ? CHOL 194 06/07/2020  ? HDL 45 06/07/2020  ? LDLCALC 127 (H) 06/07/2020  ? TRIG 120 06/07/2020  ? ?Lab Results  ?Component Value Date  ? VD25OH 9.6 (L) 06/07/2020  ? ?Lab Results  ?Component Value Date  ? HGB 15.3 06/22/2008  ? HCT 49.0 08/12/2007  ? ?No results found for: IRON, TIBC, FERRITIN ? ?Attestation  Statements:  ? ?Reviewed by clinician on day of visit: allergies, medications, problem list, medical history, surgical history, family history, social history, and previous encounter notes. ? ?I, Jackson Latino, RMA, am acting as Energy manager for Irene Limbo, FNP. ? ?I have reviewed the above documentation for accuracy and completeness, and I agree with the above. Irene Limbo, FNP  ?

## 2021-10-13 DIAGNOSIS — F4010 Social phobia, unspecified: Secondary | ICD-10-CM | POA: Diagnosis not present

## 2021-10-13 DIAGNOSIS — F422 Mixed obsessional thoughts and acts: Secondary | ICD-10-CM | POA: Diagnosis not present

## 2021-10-23 ENCOUNTER — Encounter (INDEPENDENT_AMBULATORY_CARE_PROVIDER_SITE_OTHER): Payer: Self-pay | Admitting: Nurse Practitioner

## 2021-10-23 ENCOUNTER — Ambulatory Visit (INDEPENDENT_AMBULATORY_CARE_PROVIDER_SITE_OTHER): Payer: BC Managed Care – PPO | Admitting: Nurse Practitioner

## 2021-10-23 VITALS — BP 133/77 | HR 73 | Temp 98.0°F | Ht 70.0 in | Wt 225.0 lb

## 2021-10-23 DIAGNOSIS — E559 Vitamin D deficiency, unspecified: Secondary | ICD-10-CM | POA: Diagnosis not present

## 2021-10-23 DIAGNOSIS — I10 Essential (primary) hypertension: Secondary | ICD-10-CM

## 2021-10-23 DIAGNOSIS — E7800 Pure hypercholesterolemia, unspecified: Secondary | ICD-10-CM

## 2021-10-23 DIAGNOSIS — E669 Obesity, unspecified: Secondary | ICD-10-CM

## 2021-10-23 DIAGNOSIS — R7303 Prediabetes: Secondary | ICD-10-CM

## 2021-10-23 DIAGNOSIS — Z6832 Body mass index (BMI) 32.0-32.9, adult: Secondary | ICD-10-CM

## 2021-10-23 MED ORDER — VITAMIN D (ERGOCALCIFEROL) 1.25 MG (50000 UNIT) PO CAPS: 50000.0000 [IU] | ORAL_CAPSULE | ORAL | 0 refills | Status: DC

## 2021-10-24 LAB — LIPID PANEL WITH LDL/HDL RATIO
Cholesterol, Total: 193 mg/dL (ref 100–199)
HDL: 45 mg/dL (ref >39)
LDL Chol Calc (NIH): 128 mg/dL — ABNORMAL HIGH (ref 0–99)
LDL/HDL Ratio: 2.8 ratio (ref 0.0–3.6)
Triglycerides: 108 mg/dL (ref 0–149)
VLDL Cholesterol Cal: 20 mg/dL (ref 5–40)

## 2021-10-24 LAB — CBC WITH DIFFERENTIAL/PLATELET
Basophils Absolute: 0.1 10*3/uL (ref 0.0–0.2)
Basos: 1 %
EOS (ABSOLUTE): 0.4 10*3/uL (ref 0.0–0.4)
Eos: 5 %
Hematocrit: 46.7 % (ref 37.5–51.0)
Hemoglobin: 16.3 g/dL (ref 13.0–17.7)
Immature Grans (Abs): 0 10*3/uL (ref 0.0–0.1)
Immature Granulocytes: 0 %
Lymphocytes Absolute: 2.4 10*3/uL (ref 0.7–3.1)
Lymphs: 31 %
MCH: 29 pg (ref 26.6–33.0)
MCHC: 34.9 g/dL (ref 31.5–35.7)
MCV: 83 fL (ref 79–97)
Monocytes Absolute: 0.7 10*3/uL (ref 0.1–0.9)
Monocytes: 9 %
Neutrophils Absolute: 4.1 10*3/uL (ref 1.4–7.0)
Neutrophils: 54 %
Platelets: 259 10*3/uL (ref 150–450)
RBC: 5.63 x10E6/uL (ref 4.14–5.80)
RDW: 13.4 % (ref 11.6–15.4)
WBC: 7.6 10*3/uL (ref 3.4–10.8)

## 2021-10-24 LAB — INSULIN, RANDOM: INSULIN: 10.9 u[IU]/mL (ref 2.6–24.9)

## 2021-10-24 LAB — VITAMIN D 25 HYDROXY (VIT D DEFICIENCY, FRACTURES): Vit D, 25-Hydroxy: 39.8 ng/mL (ref 30.0–100.0)

## 2021-10-24 LAB — HEMOGLOBIN A1C
Est. average glucose Bld gHb Est-mCnc: 114 mg/dL
Hgb A1c MFr Bld: 5.6 % (ref 4.8–5.6)

## 2021-10-24 NOTE — Progress Notes (Signed)
Chief Complaint:   OBESITY Michael Vazquez is here to discuss his progress with his obesity treatment plan along with follow-up of his obesity related diagnoses. Michael Vazquez is on the Category 3 Plan and states he is following his eating plan approximately 70% of the time. Michael Vazquez states he is doing 0 minutes 0 times per week.  Today's visit was #: 11 Starting weight: 237 lbs Starting date: 06/07/2020 Today's weight: 225 lbs Today's date: 10/23/2021 Total lbs lost to date: 12 lbs Total lbs lost since last in-office visit: 0  Interim History: Michael Vazquez has been eating out 1-2 days per week and hasn't made the best choices. He is drinking 2-3 bottles of water daily. He started drinking a protein shake for breakfast over the past week.   Subjective:   1. Primary hypertension Aras is taking lisinopril 20 mg. He denies chest pain, shortness of breath, and palpitations. His blood pressure looks better today 133/77. He checks his blood pressure at home but can't remember readings. His last CMP was 08/16/2021.  2. Vitamin D deficiency Michael Vazquez's last Vitamin D was 9.6. He is taking Vitamin D 50,000 IU weekly. He denies nausea, vomiting, and muscle weakness.   3. Prediabetes Michael Vazquez has never been on medications.   4. High cholesterol Michael Vazquez has never been on medications.   Assessment/Plan:   1. Primary hypertension Michael Vazquez will continue to follow up with his primary care physician. He will continue his medications as directed. He is working on healthy weight loss and exercise to improve blood pressure control. We will watch for signs of hypotension as he continues his lifestyle modifications.We will obtain labs today.   - CBC with Differential/Platelet  2. Vitamin D deficiency Low Vitamin D level contributes to fatigue and are associated with obesity, breast, and colon cancer. We will refill prescription Vitamin D 50,000 IU every week for 1 month with no refills and Michael Vazquez will follow-up for routine  testing of Vitamin D, at least 2-3 times per year to avoid over-replacement. We will obtain labs today.   - Vitamin D, Ergocalciferol, (DRISDOL) 1.25 MG (50000 UNIT) CAPS capsule; Take 1 capsule (50,000 Units total) by mouth every 7 (seven) days.  Dispense: 4 capsule; Refill: 0 - VITAMIN D 25 Hydroxy (Vit-D Deficiency, Fractures)  3. Prediabetes Michael Vazquez will continue to work on weight loss, exercise, and decreasing simple carbohydrates to help decrease the risk of diabetes. We will obtain labs today.   - CBC with Differential/Platelet - Hemoglobin A1c - Insulin, random  4. High cholesterol Cardiovascular risk and specific lipid/LDL goals reviewed.  We will obtain labs today. We discussed several lifestyle modifications today and Michael Vazquez will continue to work on diet, exercise and weight loss efforts. Orders and follow up as documented in patient record.   Counseling Intensive lifestyle modifications are the first line treatment for this issue. Dietary changes: Increase soluble fiber. Decrease simple carbohydrates. Exercise changes: Moderate to vigorous-intensity aerobic activity 150 minutes per week if tolerated. Lipid-lowering medications: see documented in medical record.  - Lipid Panel With LDL/HDL Ratio  5. Obesity, current BMI 32.4 Michael Vazquez is currently in the action stage of change. As such, his goal is to continue with weight loss efforts. He has agreed to the Category 3 Plan.   Handouts: Michael Vazquez, 100 and 200 calories daily was provided for Michael Vazquez today.   Exercise goals: No exercise has been prescribed at this time.  Behavioral modification strategies: increasing lean protein intake, increasing water intake, no skipping meals, and  planning for success.  Michael Vazquez has agreed to follow-up with our clinic in 4 weeks. He was informed of the importance of frequent follow-up visits to maximize his success with intensive lifestyle modifications for his multiple health conditions.    Objective:   Blood pressure 133/77, pulse 73, temperature 98 F (36.7 C), height 5\' 10"  (1.778 m), weight 225 lb (102.1 kg), SpO2 100 %. Body mass index is 32.28 kg/m.  General: Cooperative, alert, well developed, in no acute distress. HEENT: Conjunctivae and lids unremarkable. Cardiovascular: Regular rhythm.  Lungs: Normal work of breathing. Neurologic: No focal deficits.   Lab Results  Component Value Date   CREATININE 0.90 06/07/2020   BUN 13 06/07/2020   NA 140 06/07/2020   K 4.3 06/07/2020   CL 103 06/07/2020   CO2 24 06/07/2020   Lab Results  Component Value Date   ALT 35 06/07/2020   AST 27 06/07/2020   ALKPHOS 79 06/07/2020   BILITOT 1.0 06/07/2020   Lab Results  Component Value Date   HGBA1C 5.6 10/23/2021   HGBA1C 5.7 (H) 06/07/2020   Lab Results  Component Value Date   INSULIN 10.9 10/23/2021   INSULIN 15.8 06/07/2020   Lab Results  Component Value Date   TSH 0.860 06/07/2020   Lab Results  Component Value Date   CHOL 193 10/23/2021   HDL 45 10/23/2021   LDLCALC 128 (H) 10/23/2021   TRIG 108 10/23/2021   Lab Results  Component Value Date   VD25OH 39.8 10/23/2021   VD25OH 9.6 (L) 06/07/2020   Lab Results  Component Value Date   WBC 7.6 10/23/2021   HGB 16.3 10/23/2021   HCT 46.7 10/23/2021   MCV 83 10/23/2021   PLT 259 10/23/2021   No results found for: IRON, TIBC, FERRITIN  Attestation Statements:   Reviewed by clinician on day of visit: allergies, medications, problem list, medical history, surgical history, family history, social history, and previous encounter notes.  I, 12/23/2021, RMA, am acting as Jackson Latino for Energy manager, FNP.   I have reviewed the above documentation for accuracy and completeness, and I agree with the above. Irene Limbo, FNP

## 2021-11-03 DIAGNOSIS — F422 Mixed obsessional thoughts and acts: Secondary | ICD-10-CM | POA: Diagnosis not present

## 2021-11-03 DIAGNOSIS — F4010 Social phobia, unspecified: Secondary | ICD-10-CM | POA: Diagnosis not present

## 2021-11-13 ENCOUNTER — Ambulatory Visit (INDEPENDENT_AMBULATORY_CARE_PROVIDER_SITE_OTHER): Payer: BC Managed Care – PPO | Admitting: Nurse Practitioner

## 2021-11-13 ENCOUNTER — Encounter (INDEPENDENT_AMBULATORY_CARE_PROVIDER_SITE_OTHER): Payer: Self-pay | Admitting: Nurse Practitioner

## 2021-11-13 VITALS — BP 120/72 | HR 74 | Temp 98.0°F | Ht 70.0 in | Wt 226.0 lb

## 2021-11-13 DIAGNOSIS — E669 Obesity, unspecified: Secondary | ICD-10-CM

## 2021-11-13 DIAGNOSIS — E559 Vitamin D deficiency, unspecified: Secondary | ICD-10-CM | POA: Diagnosis not present

## 2021-11-13 DIAGNOSIS — I10 Essential (primary) hypertension: Secondary | ICD-10-CM | POA: Diagnosis not present

## 2021-11-13 DIAGNOSIS — Z6832 Body mass index (BMI) 32.0-32.9, adult: Secondary | ICD-10-CM

## 2021-11-13 DIAGNOSIS — E7800 Pure hypercholesterolemia, unspecified: Secondary | ICD-10-CM | POA: Diagnosis not present

## 2021-11-13 MED ORDER — VITAMIN D (ERGOCALCIFEROL) 1.25 MG (50000 UNIT) PO CAPS: 50000.0000 [IU] | ORAL_CAPSULE | ORAL | 0 refills | Status: DC

## 2021-11-14 NOTE — Progress Notes (Signed)
Chief Complaint:   OBESITY Michael Vazquez is here to discuss his progress with his obesity treatment plan along with follow-up of his obesity related diagnoses. Michael Vazquez is on the Category 3 Plan and states he is following his eating plan approximately 70% of the time. Michael Vazquez states he is walking for 10 minutes 3 times per week.  Today's visit was #: 12 Starting weight: 237 lbs Starting date: 06/07/2020 Today's weight: 226 lbs Today's date: 11/13/2021 Total lbs lost to date: 11 Total lbs lost since last in-office visit: 0  Interim History: Junie celebrated his father's 90th birthday since his last visit.  His sister came to visit for a couple of days.  He has no upcoming celebrations or traveling.  He notes some hunger and cravings after dinner.  He snacks on sweets occasionally after dinner.  He started walking 2 weeks ago.  He is drinking water, but not enough.  Subjective:   1. Primary hypertension Michael Vazquez is taking lisinopril 20 mg, he denies side effects of chest pain, shortness of breath, or palpitations.  2. Vitamin D deficiency Michael Vazquez is taking vitamin D 50,000 units weekly.  He denies side effects of nausea, vomiting, or muscle weakness.  I discussed labs with the patient today.  3. High cholesterol Michael Vazquez has never been on medications.  I discussed labs with the patient today.  Assessment/Plan:   1. Primary hypertension Michael Vazquez will continue to follow-up with his PCP, and he will continue his medications as directed.  Michael Vazquez is working on healthy weight loss and exercise to improve blood pressure control. We will watch for signs of hypotension as he continues his lifestyle modifications.   2. Vitamin D deficiency We will refill prescription vitamin D 50,000 units weekly for 1 month.  Side effects were discussed.  - Vitamin D, Ergocalciferol, (DRISDOL) 1.25 MG (50000 UNIT) CAPS capsule; Take 1 capsule (50,000 Units total) by mouth every 7 (seven) days.  Dispense: 4 capsule;  Refill: 0  3. High cholesterol 10-year ASCVD risk score was reviewed with the patient.Cardiovascular risk and specific lipid/LDL goals reviewed.  We discussed several lifestyle modifications today and Michael Vazquez will continue to work on diet, exercise and weight loss efforts. Orders and follow up as documented in patient record.   Counseling Intensive lifestyle modifications are the first line treatment for this issue. Dietary changes: Increase soluble fiber. Decrease simple carbohydrates. Exercise changes: Moderate to vigorous-intensity aerobic activity 150 minutes per week if tolerated. Lipid-lowering medications: see documented in medical record.  The 10-year ASCVD risk score (Arnett DK, et al., 2019) is: 3.8%   Values used to calculate the score:     Age: 51 years     Sex: Male     Is Non-Hispanic African American: No     Diabetic: No     Tobacco smoker: No     Systolic Blood Pressure: 120 mmHg     Is BP treated: Yes     HDL Cholesterol: 45 mg/dL     Total Cholesterol: 193 mg/dL  4. Obesity, current BMI 32.5 Michael Vazquez is currently in the action stage of change. As such, his goal is to continue with weight loss efforts. He has agreed to the Category 3 Plan.   Handout: Dining out.  Labs were reviewed with the patient today.  Exercise goals: Resistance training and walking.  Behavioral modification strategies: increasing water intake, no skipping meals, and planning for success.  Michael Vazquez has agreed to follow-up with our clinic in 4 weeks. He was informed  of the importance of frequent follow-up visits to maximize his success with intensive lifestyle modifications for his multiple health conditions.   Objective:   Blood pressure 120/72, pulse 74, temperature 98 F (36.7 C), height 5\' 10"  (1.778 m), weight 226 lb (102.5 kg), SpO2 96 %. Body mass index is 32.43 kg/m.  General: Cooperative, alert, well developed, in no acute distress. HEENT: Conjunctivae and lids  unremarkable. Cardiovascular: Regular rhythm.  Lungs: Normal work of breathing. Neurologic: No focal deficits.   Lab Results  Component Value Date   CREATININE 0.90 06/07/2020   BUN 13 06/07/2020   NA 140 06/07/2020   K 4.3 06/07/2020   CL 103 06/07/2020   CO2 24 06/07/2020   Lab Results  Component Value Date   ALT 35 06/07/2020   AST 27 06/07/2020   ALKPHOS 79 06/07/2020   BILITOT 1.0 06/07/2020   Lab Results  Component Value Date   HGBA1C 5.6 10/23/2021   HGBA1C 5.7 (H) 06/07/2020   Lab Results  Component Value Date   INSULIN 10.9 10/23/2021   INSULIN 15.8 06/07/2020   Lab Results  Component Value Date   TSH 0.860 06/07/2020   Lab Results  Component Value Date   CHOL 193 10/23/2021   HDL 45 10/23/2021   LDLCALC 128 (H) 10/23/2021   TRIG 108 10/23/2021   Lab Results  Component Value Date   VD25OH 39.8 10/23/2021   VD25OH 9.6 (L) 06/07/2020   Lab Results  Component Value Date   WBC 7.6 10/23/2021   HGB 16.3 10/23/2021   HCT 46.7 10/23/2021   MCV 83 10/23/2021   PLT 259 10/23/2021   No results found for: "IRON", "TIBC", "FERRITIN"  Attestation Statements:   Reviewed by clinician on day of visit: allergies, medications, problem list, medical history, surgical history, family history, social history, and previous encounter notes.   12/23/2021, am acting as Trude Mcburney for Energy manager, FNP-C.  I have reviewed the above documentation for accuracy and completeness, and I agree with the above. [employer], FNP

## 2021-11-17 DIAGNOSIS — F422 Mixed obsessional thoughts and acts: Secondary | ICD-10-CM | POA: Diagnosis not present

## 2021-11-17 DIAGNOSIS — F4010 Social phobia, unspecified: Secondary | ICD-10-CM | POA: Diagnosis not present

## 2021-11-27 ENCOUNTER — Ambulatory Visit (INDEPENDENT_AMBULATORY_CARE_PROVIDER_SITE_OTHER): Payer: BC Managed Care – PPO | Admitting: Nurse Practitioner

## 2021-11-27 DIAGNOSIS — Z79899 Other long term (current) drug therapy: Secondary | ICD-10-CM | POA: Diagnosis not present

## 2021-11-27 DIAGNOSIS — I10 Essential (primary) hypertension: Secondary | ICD-10-CM | POA: Diagnosis not present

## 2021-12-01 DIAGNOSIS — F4010 Social phobia, unspecified: Secondary | ICD-10-CM | POA: Diagnosis not present

## 2021-12-01 DIAGNOSIS — F422 Mixed obsessional thoughts and acts: Secondary | ICD-10-CM | POA: Diagnosis not present

## 2021-12-17 ENCOUNTER — Other Ambulatory Visit (INDEPENDENT_AMBULATORY_CARE_PROVIDER_SITE_OTHER): Payer: Self-pay | Admitting: Nurse Practitioner

## 2021-12-17 DIAGNOSIS — E559 Vitamin D deficiency, unspecified: Secondary | ICD-10-CM

## 2021-12-18 ENCOUNTER — Ambulatory Visit (INDEPENDENT_AMBULATORY_CARE_PROVIDER_SITE_OTHER): Payer: BC Managed Care – PPO | Admitting: Family Medicine

## 2021-12-18 ENCOUNTER — Ambulatory Visit (INDEPENDENT_AMBULATORY_CARE_PROVIDER_SITE_OTHER): Payer: BC Managed Care – PPO | Admitting: Nurse Practitioner

## 2021-12-18 DIAGNOSIS — F422 Mixed obsessional thoughts and acts: Secondary | ICD-10-CM | POA: Diagnosis not present

## 2021-12-18 DIAGNOSIS — F4010 Social phobia, unspecified: Secondary | ICD-10-CM | POA: Diagnosis not present

## 2021-12-26 ENCOUNTER — Encounter (INDEPENDENT_AMBULATORY_CARE_PROVIDER_SITE_OTHER): Payer: Self-pay

## 2021-12-29 DIAGNOSIS — F4010 Social phobia, unspecified: Secondary | ICD-10-CM | POA: Diagnosis not present

## 2021-12-29 DIAGNOSIS — F422 Mixed obsessional thoughts and acts: Secondary | ICD-10-CM | POA: Diagnosis not present

## 2022-01-08 ENCOUNTER — Ambulatory Visit (INDEPENDENT_AMBULATORY_CARE_PROVIDER_SITE_OTHER): Payer: BC Managed Care – PPO | Admitting: Nurse Practitioner

## 2022-01-08 ENCOUNTER — Encounter (INDEPENDENT_AMBULATORY_CARE_PROVIDER_SITE_OTHER): Payer: Self-pay | Admitting: Nurse Practitioner

## 2022-01-08 VITALS — BP 133/79 | HR 81 | Temp 98.8°F | Ht 70.0 in | Wt 224.0 lb

## 2022-01-08 DIAGNOSIS — I10 Essential (primary) hypertension: Secondary | ICD-10-CM | POA: Diagnosis not present

## 2022-01-08 DIAGNOSIS — E559 Vitamin D deficiency, unspecified: Secondary | ICD-10-CM | POA: Diagnosis not present

## 2022-01-08 DIAGNOSIS — Z6832 Body mass index (BMI) 32.0-32.9, adult: Secondary | ICD-10-CM | POA: Diagnosis not present

## 2022-01-08 DIAGNOSIS — E669 Obesity, unspecified: Secondary | ICD-10-CM

## 2022-01-08 MED ORDER — VITAMIN D (ERGOCALCIFEROL) 1.25 MG (50000 UNIT) PO CAPS: 50000.0000 [IU] | ORAL_CAPSULE | ORAL | 0 refills | Status: DC

## 2022-01-16 NOTE — Progress Notes (Signed)
Chief Complaint:   OBESITY Michael Vazquez is here to discuss his progress with his obesity treatment plan along with follow-up of his obesity related diagnoses. Michael Vazquez is on the Category 3 Plan and states he is following his eating plan approximately 70% of the time. Khameron states he is exercising 0 minutes 0 times per week.  Today's visit was #: 13 Starting weight: 237 lbs Starting date: 06/07/2020 Today's weight: 224 lbs Today's date: 01/08/2022 Total lbs lost to date: 13 lbs Total lbs lost since last in-office visit: 2  Interim History: Michael Vazquez has done well with weight loss. Eating out 1-2 days per week. He is not skipping meals and is meeting protein goals. Drinking 2 bottles of water and coffee daily. Has not had time exercise.   Subjective:   1. Vitamin D deficiency Michael Vazquez is currently taking prescription Vit D 50,000 IU once a week. Denies any nausea, vomiting or muscle weakness.  2. Primary hypertension Michael Vazquez is taking Lisinopril 20 mg. Denies any side effects. Denies any chest pain,shortness of breath or palpitations. Family history: Father and mother.  Assessment/Plan:   1. Vitamin D deficiency We will refill Vit D 50,000 IU once a week for 1 month with 0 refills. Side effects discussed.   -Refill Vitamin D, Ergocalciferol, (DRISDOL) 1.25 MG (50000 UNIT) CAPS capsule; Take 1 capsule (50,000 Units total) by mouth every 7 (seven) days.  Dispense: 4 capsule; Refill: 0  2. Primary hypertension Continue to follow up with PCP.  Continue meds as directed  Michael Vazquez is working on healthy weight loss and exercise to improve blood pressure control. We will watch for signs of hypotension as he continues his lifestyle modifications.   3. Obesity, current BMI 32.1 Michael Vazquez is currently in the action stage of change. As such, his goal is to continue with weight loss efforts. He has agreed to the Category 3 Plan.   Exercise goals: As is.   Behavioral modification strategies: increasing  lean protein intake, increasing water intake, and no skipping meals.  Michael Vazquez has agreed to follow-up with our clinic in 4 weeks. He was informed of the importance of frequent follow-up visits to maximize his success with intensive lifestyle modifications for his multiple health conditions.   Objective:   Blood pressure 133/79, pulse 81, temperature 98.8 F (37.1 C), height 5\' 10"  (1.778 m), weight 224 lb (101.6 kg), SpO2 99 %. Body mass index is 32.14 kg/m.  General: Cooperative, alert, well developed, in no acute distress. HEENT: Conjunctivae and lids unremarkable. Cardiovascular: Regular rhythm.  Lungs: Normal work of breathing. Neurologic: No focal deficits.   Lab Results  Component Value Date   CREATININE 0.90 06/07/2020   BUN 13 06/07/2020   NA 140 06/07/2020   K 4.3 06/07/2020   CL 103 06/07/2020   CO2 24 06/07/2020   Lab Results  Component Value Date   ALT 35 06/07/2020   AST 27 06/07/2020   ALKPHOS 79 06/07/2020   BILITOT 1.0 06/07/2020   Lab Results  Component Value Date   HGBA1C 5.6 10/23/2021   HGBA1C 5.7 (H) 06/07/2020   Lab Results  Component Value Date   INSULIN 10.9 10/23/2021   INSULIN 15.8 06/07/2020   Lab Results  Component Value Date   TSH 0.860 06/07/2020   Lab Results  Component Value Date   CHOL 193 10/23/2021   HDL 45 10/23/2021   LDLCALC 128 (H) 10/23/2021   TRIG 108 10/23/2021   Lab Results  Component Value Date   VD25OH  39.8 10/23/2021   VD25OH 9.6 (L) 06/07/2020   Lab Results  Component Value Date   WBC 7.6 10/23/2021   HGB 16.3 10/23/2021   HCT 46.7 10/23/2021   MCV 83 10/23/2021   PLT 259 10/23/2021   No results found for: "IRON", "TIBC", "FERRITIN"  Attestation Statements:   Reviewed by clinician on day of visit: allergies, medications, problem list, medical history, surgical history, family history, social history, and previous encounter notes.  I, Brendell Tyus, RMA, am acting as transcriptionist for Irene Limbo, FNP.  I have reviewed the above documentation for accuracy and completeness, and I agree with the above. Irene Limbo, FNP

## 2022-01-19 DIAGNOSIS — F4010 Social phobia, unspecified: Secondary | ICD-10-CM | POA: Diagnosis not present

## 2022-01-19 DIAGNOSIS — F422 Mixed obsessional thoughts and acts: Secondary | ICD-10-CM | POA: Diagnosis not present

## 2022-01-29 DIAGNOSIS — H6992 Unspecified Eustachian tube disorder, left ear: Secondary | ICD-10-CM | POA: Diagnosis not present

## 2022-02-02 DIAGNOSIS — F4010 Social phobia, unspecified: Secondary | ICD-10-CM | POA: Diagnosis not present

## 2022-02-02 DIAGNOSIS — F422 Mixed obsessional thoughts and acts: Secondary | ICD-10-CM | POA: Diagnosis not present

## 2022-02-05 ENCOUNTER — Encounter (INDEPENDENT_AMBULATORY_CARE_PROVIDER_SITE_OTHER): Payer: Self-pay | Admitting: Nurse Practitioner

## 2022-02-05 ENCOUNTER — Ambulatory Visit (INDEPENDENT_AMBULATORY_CARE_PROVIDER_SITE_OTHER): Payer: BC Managed Care – PPO | Admitting: Nurse Practitioner

## 2022-02-05 VITALS — BP 124/83 | HR 81 | Temp 98.3°F | Ht 70.0 in | Wt 224.0 lb

## 2022-02-05 DIAGNOSIS — Z6832 Body mass index (BMI) 32.0-32.9, adult: Secondary | ICD-10-CM

## 2022-02-05 DIAGNOSIS — E669 Obesity, unspecified: Secondary | ICD-10-CM | POA: Diagnosis not present

## 2022-02-05 DIAGNOSIS — E559 Vitamin D deficiency, unspecified: Secondary | ICD-10-CM | POA: Diagnosis not present

## 2022-02-05 DIAGNOSIS — E7800 Pure hypercholesterolemia, unspecified: Secondary | ICD-10-CM | POA: Diagnosis not present

## 2022-02-05 MED ORDER — VITAMIN D (ERGOCALCIFEROL) 1.25 MG (50000 UNIT) PO CAPS: 50000.0000 [IU] | ORAL_CAPSULE | ORAL | 0 refills | Status: DC

## 2022-02-06 NOTE — Progress Notes (Unsigned)
Chief Complaint:   OBESITY Michael Vazquez is here to discuss his progress with his obesity treatment plan along with follow-up of his obesity related diagnoses. Michael Vazquez is on the Category 3 Plan and states he is following his eating plan approximately 70% of the time. Michael Vazquez states he is exercising 0 minutes 0 times per week.  Today's visit was #: 14 Starting weight: 237 lbs Starting date: 06/07/2020 Today's weight: 224 lbs Today's date: 02/05/2022 Total lbs lost to date: 13 lbs Total lbs lost since last in-office visit: 0  Interim History: [name] has maintained weight since last visit. Feels eating too many carbs. Breakfast is a bagel with butter or protein shake. Lunch is Q with cheese pizza. Dinner is pasta. Drinking water, 3 bottles daily and coffee. Occasional polyphaiga and cravings.    Subjective:   1. Vitamin D deficiency Michael Vazquez is currently taking prescription Vit D 50,000 IU once a week.   2. High cholesterol Michael Vazquez has never been on medication. Family history: none.  Assessment/Plan:   1. Vitamin D deficiency We will refill Vit D 50,000 IU once a week for 1 month with 0 refills. Side effects discussed. Low Vitamin D level contributes to fatigue and are associated with obesity, breast, and colon cancer. He agrees to continue to take prescription Vitamin D @50 ,000 IU every week and will follow-up for routine testing of Vitamin D, at least 2-3 times per year to avoid over-replacement.   -Refill Vitamin D, Ergocalciferol, (DRISDOL) 1.25 MG (50000 UNIT) CAPS capsule; Take 1 capsule (50,000 Units total) by mouth every 7 (seven) days.  Dispense: 4 capsule; Refill: 0  2. High cholesterol Cardiovascular risk and specific lipid/LDL goals reviewed.  We discussed several lifestyle modifications today and Michael Vazquez will continue to work on diet, exercise and weight loss efforts. Orders and follow up as documented in patient record.   Counseling Intensive lifestyle modifications are the  first line treatment for this issue. Dietary changes: Increase soluble fiber. Decrease simple carbohydrates. Exercise changes: Moderate to vigorous-intensity aerobic activity 150 minutes per week if tolerated. Lipid-lowering medications: see documented in medical record.   3. Obesity, current BMI 32.3 Michael Vazquez is currently in the action stage of change. As such, his goal is to continue with weight loss efforts. He has agreed to the Category 3 Plan.   Michael Vazquez is to track and will review Macros at next visit. Handout given: Dinning out guide.  Exercise goals: All adults should avoid inactivity. Some physical activity is better than none, and adults who participate in any amount of physical activity gain some health benefits.  Behavioral modification strategies: increasing lean protein intake, increasing vegetables, and increasing water intake.  Michael Vazquez has agreed to follow-up with our clinic in 4 weeks. He was informed of the importance of frequent follow-up visits to maximize his success with intensive lifestyle modifications for his multiple health conditions.   Objective:   Blood pressure 124/83, pulse 81, temperature 98.3 F (36.8 C), height 5\' 10"  (1.778 m), weight 224 lb (101.6 kg), SpO2 95 %. Body mass index is 32.14 kg/m.  General: Cooperative, alert, well developed, in no acute distress. HEENT: Conjunctivae and lids unremarkable. Cardiovascular: Regular rhythm.  Lungs: Normal work of breathing. Neurologic: No focal deficits.   Lab Results  Component Value Date   CREATININE 0.90 06/07/2020   BUN 13 06/07/2020   NA 140 06/07/2020   K 4.3 06/07/2020   CL 103 06/07/2020   CO2 24 06/07/2020   Lab Results  Component Value  Date   ALT 35 06/07/2020   AST 27 06/07/2020   ALKPHOS 79 06/07/2020   BILITOT 1.0 06/07/2020   Lab Results  Component Value Date   HGBA1C 5.6 10/23/2021   HGBA1C 5.7 (H) 06/07/2020   Lab Results  Component Value Date   INSULIN 10.9 10/23/2021    INSULIN 15.8 06/07/2020   Lab Results  Component Value Date   TSH 0.860 06/07/2020   Lab Results  Component Value Date   CHOL 193 10/23/2021   HDL 45 10/23/2021   LDLCALC 128 (H) 10/23/2021   TRIG 108 10/23/2021   Lab Results  Component Value Date   VD25OH 39.8 10/23/2021   VD25OH 9.6 (L) 06/07/2020   Lab Results  Component Value Date   WBC 7.6 10/23/2021   HGB 16.3 10/23/2021   HCT 46.7 10/23/2021   MCV 83 10/23/2021   PLT 259 10/23/2021   No results found for: "IRON", "TIBC", "FERRITIN"  Attestation Statements:   Reviewed by clinician on day of visit: allergies, medications, problem list, medical history, surgical history, family history, social history, and previous encounter notes.  I, Brendell Tyus, RMA, am acting as transcriptionist for Everardo Pacific, FNP.  I have reviewed the above documentation for accuracy and completeness, and I agree with the above. Everardo Pacific, FNP

## 2022-02-12 ENCOUNTER — Other Ambulatory Visit (INDEPENDENT_AMBULATORY_CARE_PROVIDER_SITE_OTHER): Payer: Self-pay | Admitting: Nurse Practitioner

## 2022-02-12 DIAGNOSIS — E559 Vitamin D deficiency, unspecified: Secondary | ICD-10-CM

## 2022-02-16 DIAGNOSIS — F4010 Social phobia, unspecified: Secondary | ICD-10-CM | POA: Diagnosis not present

## 2022-02-16 DIAGNOSIS — F422 Mixed obsessional thoughts and acts: Secondary | ICD-10-CM | POA: Diagnosis not present

## 2022-02-26 DIAGNOSIS — F4010 Social phobia, unspecified: Secondary | ICD-10-CM | POA: Diagnosis not present

## 2022-02-26 DIAGNOSIS — F422 Mixed obsessional thoughts and acts: Secondary | ICD-10-CM | POA: Diagnosis not present

## 2022-03-05 ENCOUNTER — Ambulatory Visit (INDEPENDENT_AMBULATORY_CARE_PROVIDER_SITE_OTHER): Payer: BC Managed Care – PPO | Admitting: Family Medicine

## 2022-03-12 DIAGNOSIS — F422 Mixed obsessional thoughts and acts: Secondary | ICD-10-CM | POA: Diagnosis not present

## 2022-03-12 DIAGNOSIS — F4010 Social phobia, unspecified: Secondary | ICD-10-CM | POA: Diagnosis not present

## 2022-03-26 ENCOUNTER — Ambulatory Visit (INDEPENDENT_AMBULATORY_CARE_PROVIDER_SITE_OTHER): Payer: BC Managed Care – PPO | Admitting: Family Medicine

## 2022-03-26 DIAGNOSIS — F422 Mixed obsessional thoughts and acts: Secondary | ICD-10-CM | POA: Diagnosis not present

## 2022-03-26 DIAGNOSIS — F4010 Social phobia, unspecified: Secondary | ICD-10-CM | POA: Diagnosis not present

## 2022-04-02 ENCOUNTER — Emergency Department (HOSPITAL_COMMUNITY)
Admission: EM | Admit: 2022-04-02 | Discharge: 2022-04-03 | Disposition: A | Discharge status: Home / Self Care | Payer: BC Managed Care – PPO | Attending: Emergency Medicine | Admitting: Emergency Medicine

## 2022-04-02 ENCOUNTER — Emergency Department (HOSPITAL_COMMUNITY): Payer: BC Managed Care – PPO

## 2022-04-02 ENCOUNTER — Encounter (HOSPITAL_COMMUNITY): Payer: Self-pay

## 2022-04-02 ENCOUNTER — Other Ambulatory Visit: Payer: Self-pay

## 2022-04-02 DIAGNOSIS — R0789 Other chest pain: Secondary | ICD-10-CM | POA: Diagnosis not present

## 2022-04-02 DIAGNOSIS — R1013 Epigastric pain: Secondary | ICD-10-CM | POA: Insufficient documentation

## 2022-04-02 DIAGNOSIS — R079 Chest pain, unspecified: Secondary | ICD-10-CM

## 2022-04-02 DIAGNOSIS — K6389 Other specified diseases of intestine: Secondary | ICD-10-CM | POA: Diagnosis not present

## 2022-04-02 DIAGNOSIS — R202 Paresthesia of skin: Secondary | ICD-10-CM | POA: Diagnosis not present

## 2022-04-02 DIAGNOSIS — D72829 Elevated white blood cell count, unspecified: Secondary | ICD-10-CM | POA: Diagnosis not present

## 2022-04-02 DIAGNOSIS — H6123 Impacted cerumen, bilateral: Secondary | ICD-10-CM | POA: Diagnosis not present

## 2022-04-02 DIAGNOSIS — Z79899 Other long term (current) drug therapy: Secondary | ICD-10-CM | POA: Diagnosis not present

## 2022-04-02 DIAGNOSIS — R5383 Other fatigue: Secondary | ICD-10-CM | POA: Insufficient documentation

## 2022-04-02 DIAGNOSIS — R0602 Shortness of breath: Secondary | ICD-10-CM | POA: Insufficient documentation

## 2022-04-02 DIAGNOSIS — K5730 Diverticulosis of large intestine without perforation or abscess without bleeding: Secondary | ICD-10-CM | POA: Diagnosis not present

## 2022-04-02 DIAGNOSIS — R61 Generalized hyperhidrosis: Secondary | ICD-10-CM | POA: Insufficient documentation

## 2022-04-02 DIAGNOSIS — Z9889 Other specified postprocedural states: Secondary | ICD-10-CM | POA: Diagnosis not present

## 2022-04-02 DIAGNOSIS — J984 Other disorders of lung: Secondary | ICD-10-CM | POA: Diagnosis not present

## 2022-04-02 DIAGNOSIS — H903 Sensorineural hearing loss, bilateral: Secondary | ICD-10-CM | POA: Diagnosis not present

## 2022-04-02 DIAGNOSIS — K8020 Calculus of gallbladder without cholecystitis without obstruction: Secondary | ICD-10-CM | POA: Diagnosis not present

## 2022-04-02 HISTORY — DX: Essential (primary) hypertension: I10

## 2022-04-02 LAB — COMPREHENSIVE METABOLIC PANEL
ALT: 32 U/L (ref 0–44)
AST: 30 U/L (ref 15–41)
Albumin: 3.5 g/dL (ref 3.5–5.0)
Alkaline Phosphatase: 57 U/L (ref 38–126)
Anion gap: 13 (ref 5–15)
BUN: 17 mg/dL (ref 6–20)
CO2: 22 mmol/L (ref 22–32)
Calcium: 9.7 mg/dL (ref 8.9–10.3)
Chloride: 105 mmol/L (ref 98–111)
Creatinine, Ser: 1.08 mg/dL (ref 0.61–1.24)
GFR, Estimated: >60 mL/min (ref >60)
Glucose, Bld: 194 mg/dL — ABNORMAL HIGH (ref 70–99)
Potassium: 3.3 mmol/L — ABNORMAL LOW (ref 3.5–5.1)
Sodium: 140 mmol/L (ref 135–145)
Total Bilirubin: 0.7 mg/dL (ref 0.3–1.2)
Total Protein: 6.8 g/dL (ref 6.5–8.1)

## 2022-04-02 LAB — URINALYSIS, ROUTINE W REFLEX MICROSCOPIC
Bilirubin Urine: NEGATIVE
Glucose, UA: NEGATIVE mg/dL
Hgb urine dipstick: NEGATIVE
Ketones, ur: 20 mg/dL — AB
Leukocytes,Ua: NEGATIVE
Nitrite: NEGATIVE
Protein, ur: 30 mg/dL — AB
Specific Gravity, Urine: 1.04 — ABNORMAL HIGH (ref 1.005–1.030)
pH: 7 (ref 5.0–8.0)

## 2022-04-02 LAB — CBC
HCT: 42.4 % (ref 39.0–52.0)
Hemoglobin: 15.1 g/dL (ref 13.0–17.0)
MCH: 28.7 pg (ref 26.0–34.0)
MCHC: 35.6 g/dL (ref 30.0–36.0)
MCV: 80.5 fL (ref 80.0–100.0)
Platelets: 253 10*3/uL (ref 150–400)
RBC: 5.27 MIL/uL (ref 4.22–5.81)
RDW: 13.3 % (ref 11.5–15.5)
WBC: 15.3 10*3/uL — ABNORMAL HIGH (ref 4.0–10.5)
nRBC: 0 % (ref 0.0–0.2)

## 2022-04-02 LAB — LIPASE, BLOOD: Lipase: 32 U/L (ref 11–51)

## 2022-04-02 LAB — RAPID URINE DRUG SCREEN, HOSP PERFORMED
Amphetamines: NOT DETECTED
Barbiturates: NOT DETECTED
Benzodiazepines: NOT DETECTED
Cocaine: NOT DETECTED
Opiates: NOT DETECTED
Tetrahydrocannabinol: NOT DETECTED

## 2022-04-02 LAB — PROTIME-INR
INR: 1.1 (ref 0.8–1.2)
Prothrombin Time: 13.6 seconds (ref 11.4–15.2)

## 2022-04-02 LAB — TROPONIN I (HIGH SENSITIVITY)
Troponin I (High Sensitivity): 3 ng/L (ref <18)
Troponin I (High Sensitivity): 3 ng/L (ref <18)

## 2022-04-02 LAB — MAGNESIUM: Magnesium: 1.8 mg/dL (ref 1.7–2.4)

## 2022-04-02 LAB — BRAIN NATRIURETIC PEPTIDE: B Natriuretic Peptide: 12.1 pg/mL (ref 0.0–100.0)

## 2022-04-02 MED ORDER — HYDROMORPHONE HCL 1 MG/ML IJ SOLN: 0.5000 mg | Freq: Once | INTRAMUSCULAR | Status: DC

## 2022-04-02 MED ORDER — AMOXICILLIN-POT CLAVULANATE 875-125 MG PO TABS: 1.0000 | ORAL_TABLET | Freq: Two times a day (BID) | ORAL | 0 refills | Status: DC

## 2022-04-02 MED ORDER — FAMOTIDINE IN NACL 20-0.9 MG/50ML-% IV SOLN
20.0000 mg | Freq: Once | INTRAVENOUS | Status: AC
  Administered 2022-04-02: 20 mg via INTRAVENOUS
  Filled 2022-04-02: qty 50

## 2022-04-02 MED ORDER — METOCLOPRAMIDE HCL 5 MG/ML IJ SOLN
10.0000 mg | Freq: Once | INTRAMUSCULAR | Status: AC
  Administered 2022-04-02: 10 mg via INTRAVENOUS
  Filled 2022-04-02: qty 2

## 2022-04-02 MED ORDER — HYDROMORPHONE HCL 1 MG/ML IJ SOLN
0.5000 mg | Freq: Once | INTRAMUSCULAR | Status: AC
  Administered 2022-04-02: 0.5 mg via INTRAVENOUS
  Filled 2022-04-02: qty 1

## 2022-04-02 MED ORDER — ALUM & MAG HYDROXIDE-SIMETH 200-200-20 MG/5ML PO SUSP
30.0000 mL | Freq: Once | ORAL | Status: AC
  Administered 2022-04-02: 30 mL via ORAL
  Filled 2022-04-02: qty 30

## 2022-04-02 MED ORDER — IOHEXOL 350 MG/ML SOLN
80.0000 mL | Freq: Once | INTRAVENOUS | Status: AC | PRN
  Administered 2022-04-02: 80 mL via INTRAVENOUS

## 2022-04-02 MED ORDER — FAMOTIDINE 20 MG PO TABS: 20.0000 mg | ORAL_TABLET | Freq: Two times a day (BID) | ORAL | 0 refills | Status: DC

## 2022-04-02 MED ORDER — POTASSIUM CHLORIDE CRYS ER 20 MEQ PO TBCR
40.0000 meq | EXTENDED_RELEASE_TABLET | Freq: Once | ORAL | Status: AC
  Administered 2022-04-03: 40 meq via ORAL
  Filled 2022-04-02: qty 2

## 2022-04-02 MED ORDER — LIDOCAINE VISCOUS HCL 2 % MT SOLN
15.0000 mL | Freq: Once | OROMUCOSAL | Status: AC
  Administered 2022-04-02: 15 mL via ORAL
  Filled 2022-04-02: qty 15

## 2022-04-02 NOTE — ED Notes (Signed)
Patient provided with warm blanket for comfort ?

## 2022-04-02 NOTE — ED Triage Notes (Signed)
Coming from home gradual onset of CP asarting around 3pm consistently gotten worse with tingling in left arm pale clammy   112/66 58  HR 99% 2L for comfort  CBG 169  324 ASA 0.4 NG

## 2022-04-02 NOTE — ED Provider Notes (Signed)
[facility] EMERGENCY DEPARTMENT Provider Note   CSN: [id number] Arrival date & time: 04/02/22  1815     History {Add pertinent medical, surgical, social history, OB history to HPI:1} Chief Complaint  Patient presents with   Chest Pain    Michael Vazquez is a 51 y.o. male.   Chest Pain Associated symptoms: abdominal pain, diaphoresis, fatigue and shortness of breath   Patient presents for chest pain.  Medical history includes prediabetes, GERD, HLD.  Onset of chest pain was 3 PM.  Patient describes a tightness and pressure in lower bilateral areas of chest, radiating to the abdomen with left arm tingling.  At the time, he was laying in bed.  He had recently eaten a large lunch.  EMS was called to the scene.  When EMS arrived, they noted that he was pale and sweaty.  Initial blood pressure and heart rate were normal.  He was given 324 of ASA and 1 SL NTG.  He reports very mild improvement in symptoms following NTG.  Currently, he endorses continued pressure.  He denies any current nausea.     Home Medications Prior to Admission medications   Medication Sig Start Date End Date Taking? Authorizing Provider  fluticasone (FLONASE) 50 MCG/ACT nasal spray Place 2 sprays into both nostrils daily. 01/29/22   [provider]  lisinopril (ZESTRIL) 20 MG tablet 1 tablet    [provider]  Vitamin D, Ergocalciferol, (DRISDOL) 1.25 MG (50000 UNIT) CAPS capsule Take 1 capsule (50,000 Units total) by mouth every 7 (seven) days. 02/05/22   Irene Limbo, FNP      Allergies    Patient has no known allergies.    Review of Systems   Review of Systems  Constitutional:  Positive for diaphoresis and fatigue.  Respiratory:  Positive for shortness of breath.   Cardiovascular:  Positive for chest pain.  Gastrointestinal:  Positive for abdominal pain.  Skin:  Positive for pallor.  All other systems reviewed and are negative.   Physical Exam Updated Vital  Signs There were no vitals taken for this visit. Physical Exam Vitals and nursing note reviewed.  Constitutional:      General: He is not in acute distress.    Appearance: He is well-developed. He is not ill-appearing, toxic-appearing or diaphoretic.  HENT:     Head: Normocephalic and atraumatic.  Eyes:     Conjunctiva/sclera: Conjunctivae normal.  Cardiovascular:     Rate and Rhythm: Normal rate and regular rhythm.     Heart sounds: No murmur heard. Pulmonary:     Effort: Pulmonary effort is normal. Tachypnea present. No respiratory distress.     Breath sounds: Normal breath sounds. No decreased breath sounds, wheezing, rhonchi or rales.  Chest:     Chest wall: Tenderness present.  Abdominal:     Palpations: Abdomen is soft.     Tenderness: There is no abdominal tenderness.  Musculoskeletal:        General: No swelling.     Cervical back: Normal range of motion and neck supple.     Right lower leg: No edema.     Left lower leg: No edema.  Skin:    General: Skin is warm and dry.     Capillary Refill: Capillary refill takes less than 2 seconds.     Coloration: Skin is not cyanotic or pale.  Neurological:     General: No focal deficit present.     Mental Status: He is alert and oriented to  person, place, and time.     Cranial Nerves: No cranial nerve deficit.     Motor: No weakness.  Psychiatric:        Mood and Affect: Mood normal.        Behavior: Behavior normal.     ED Results / Procedures / Treatments   Labs (all labs ordered are listed, but only abnormal results are displayed) Labs Reviewed - No data to display  EKG None  Radiology No results found.  Procedures Procedures  {Document cardiac monitor, telemetry assessment procedure when appropriate:1}  Medications Ordered in ED Medications - No data to display  ED Course/ Medical Decision Making/ A&P                           Medical Decision Making  This patient presents to the ED for concern of  chest pain, this involves an extensive number of treatment options, and is a complaint that carries with it a high risk of complications and morbidity.  The differential diagnosis includes ACS, GERD, aortic dissection, gastritis, bowel perforation, SBO, enteritis, PE   Co morbidities that complicate the patient evaluation  prediabetes, GERD, HLD   Additional history obtained:  Additional history obtained from EMS External records from outside source obtained and reviewed including EMR   Lab Tests:  I Ordered, and personally interpreted labs.  The pertinent results include:  ***   Imaging Studies ordered:  I ordered imaging studies including ***  I independently visualized and interpreted imaging which showed *** I agree with the radiologist interpretation   Cardiac Monitoring: / EKG:  The patient was maintained on a cardiac monitor.  I personally viewed and interpreted the cardiac monitored which showed an underlying rhythm of: ***   Consultations Obtained:  I requested consultation with the ***,  and discussed lab and imaging findings as well as pertinent plan - they recommend: ***   Problem List / ED Course / Critical interventions / Medication management  *** I ordered medication including ***  for ***  Reevaluation of the patient after these medicines showed that the patient {resolved/improved/worsened:23923::"improved"} I have reviewed the patients home medicines and have made adjustments as needed   Social Determinants of Health:  ***   Test / Admission - Considered:  ***   {Document critical care time when appropriate:1} {Document review of labs and clinical decision tools ie heart score, Chads2Vasc2 etc:1}  {Document your independent review of radiology images, and any outside records:1} {Document your discussion with family members, caretakers, and with consultants:1} {Document social determinants of health affecting pt's care:1} {Document your  decision making why or why not admission, treatments were needed:1} Final Clinical Impression(s) / ED Diagnoses Final diagnoses:  None    Rx / DC Orders ED Discharge Orders     None

## 2022-04-02 NOTE — Discharge Instructions (Signed)
Your CT scan showed diverticulitis.  This is an inflammation of outpouchings in the large intestine.  There was an antibiotic sent to your pharmacy called Augmentin.  Take this medication as prescribed.  You can develop complications from diverticulitis  Complications include perforation and abscess formation.  You will need to return to the emergency department if you have any worsening of symptoms including severe lower abdominal pain, fevers, chills, nausea, vomiting.  Your upper abdominal discomfort may have been caused by gallstones and/or stomach inflammation.  For stomach inflammation, there was a medication that was sent to your pharmacy called Pepcid.  Take this daily and talk to your primary care doctor about long-term use.    Your upper abdominal discomfort may have also been caused by gallstones.  These were seen on your CT scan.  These can also cause complications including inflammation and infection of your gallbladder.  Keep this in mind and return to the emergency department for worsening of symptoms in your upper abdomen as well.  There is a telephone number below that you can call to follow-up with a surgeon in the office.  If gallstones are causing you frequent and persistent symptoms, they can talk to you about gallbladder removal.

## 2022-04-04 ENCOUNTER — Encounter: Payer: Self-pay | Admitting: Gastroenterology

## 2022-04-09 ENCOUNTER — Ambulatory Visit (INDEPENDENT_AMBULATORY_CARE_PROVIDER_SITE_OTHER): Payer: BC Managed Care – PPO | Admitting: Family Medicine

## 2022-04-09 DIAGNOSIS — F4010 Social phobia, unspecified: Secondary | ICD-10-CM | POA: Diagnosis not present

## 2022-04-09 DIAGNOSIS — H9202 Otalgia, left ear: Secondary | ICD-10-CM | POA: Diagnosis not present

## 2022-04-09 DIAGNOSIS — H903 Sensorineural hearing loss, bilateral: Secondary | ICD-10-CM | POA: Diagnosis not present

## 2022-04-09 DIAGNOSIS — M2669 Other specified disorders of temporomandibular joint: Secondary | ICD-10-CM | POA: Diagnosis not present

## 2022-04-09 DIAGNOSIS — F422 Mixed obsessional thoughts and acts: Secondary | ICD-10-CM | POA: Diagnosis not present

## 2022-04-16 ENCOUNTER — Ambulatory Visit (INDEPENDENT_AMBULATORY_CARE_PROVIDER_SITE_OTHER): Payer: BC Managed Care – PPO | Admitting: Physician Assistant

## 2022-04-23 ENCOUNTER — Encounter: Payer: Self-pay | Admitting: Surgery

## 2022-04-23 DIAGNOSIS — K5730 Diverticulosis of large intestine without perforation or abscess without bleeding: Secondary | ICD-10-CM | POA: Diagnosis not present

## 2022-04-23 DIAGNOSIS — K8020 Calculus of gallbladder without cholecystitis without obstruction: Secondary | ICD-10-CM | POA: Diagnosis not present

## 2022-04-23 DIAGNOSIS — R079 Chest pain, unspecified: Secondary | ICD-10-CM | POA: Diagnosis not present

## 2022-04-23 DIAGNOSIS — K429 Umbilical hernia without obstruction or gangrene: Secondary | ICD-10-CM | POA: Diagnosis not present

## 2022-04-30 DIAGNOSIS — F422 Mixed obsessional thoughts and acts: Secondary | ICD-10-CM | POA: Diagnosis not present

## 2022-04-30 DIAGNOSIS — F4010 Social phobia, unspecified: Secondary | ICD-10-CM | POA: Diagnosis not present

## 2022-05-07 ENCOUNTER — Ambulatory Visit (INDEPENDENT_AMBULATORY_CARE_PROVIDER_SITE_OTHER): Payer: BC Managed Care – PPO | Admitting: Physician Assistant

## 2022-05-08 ENCOUNTER — Ambulatory Visit (INDEPENDENT_AMBULATORY_CARE_PROVIDER_SITE_OTHER): Payer: BC Managed Care – PPO | Admitting: Gastroenterology

## 2022-05-08 ENCOUNTER — Encounter: Payer: Self-pay | Admitting: Gastroenterology

## 2022-05-08 VITALS — BP 124/82 | HR 81 | Ht 70.0 in | Wt 232.4 lb

## 2022-05-08 DIAGNOSIS — Z1211 Encounter for screening for malignant neoplasm of colon: Secondary | ICD-10-CM

## 2022-05-08 DIAGNOSIS — R1010 Upper abdominal pain, unspecified: Secondary | ICD-10-CM

## 2022-05-08 DIAGNOSIS — R079 Chest pain, unspecified: Secondary | ICD-10-CM

## 2022-05-08 DIAGNOSIS — Z1212 Encounter for screening for malignant neoplasm of rectum: Secondary | ICD-10-CM | POA: Diagnosis not present

## 2022-05-08 MED ORDER — NA SULFATE-K SULFATE-MG SULF 17.5-3.13-1.6 GM/177ML PO SOLN: 1.0000 | Freq: Once | ORAL | 0 refills | Status: AC

## 2022-05-08 NOTE — Progress Notes (Signed)
HPI : Michael Vazquez is a very pleasant 51 year old male with history of hypertension and hyperlipidemia who is referred to [place] after he presented to the emergency department with upper abdominal/chest pain a month ago.  He reports developing pressure-like sensation in his lower chest that occurred when he was lying down around 6 PM to take a nap.  The pain became progressively worse and eventually was quite severe and associated with diaphoresis.  He called EMS because he thought he was having a heart attack.  In the emergency department he was found to have negative cardiac enzymes and a normal EKG.  A CT of the chest abdomen/pelvis was negative for pulmonary embolism and aortic dissection but did note presence of gallstones without evidence of cholecystitis.  The CT also showed diverticulosis with questionable inflammatory changes suspicious for diverticulitis.  The pain resolved after about 5 hours.  He was referred to general surgery for consideration of cholecystectomy, and referred to [place] for consideration of upper endoscopy.  Patient states that he had another episode of chest discomfort about a week after his emergency department visit, which occurred in the middle the night and woke him from sleep.  However, this episode was much less severe and lasted much less time than the wanted to come to the ER.  He reports having history of GERD symptoms years ago, which were similar in character to the symptoms he experienced last month.  However his old symptoms were much less severe.  Primarily he would experience a pressure-like sensation in his lower chest, sometimes burning in sensation.  No problems with acid regurgitation.  He took some over-the-counter GERD medicines as needed and made some dietary changes, and the symptoms had not bothered him for quite some time. He denies any other atypical GERD symptoms such as cough, hoarseness, throat irritation/globus sensation.  No dysphagia.  No nausea or  vomiting.  He denies any chronic lower GI symptoms to include lower abdominal pain, diarrhea, constipation or hematochezia.  He has never had an upper or a lower endoscopy.  He denies any family history of any GI malignancy.   Past Medical History:  Diagnosis Date   Chest pain    GERD (gastroesophageal reflux disease)    Hernia    Hypercholesteremia    Hypertension      Past Surgical History:  Procedure Laterality Date   INGUINAL HERNIA REPAIR Left 2010   recurrent [facility] _ Dr Jamey Ripa open w mesh   LAPAROSCOPIC INGUINAL HERNIA REPAIR Bilateral 2001   Dr Orson Slick   Family History  Problem Relation Age of Onset   High blood pressure Mother    High Cholesterol Mother    Obesity Mother    High blood pressure Father    Heart disease Father    Cancer Father    Social History   Tobacco Use   Smoking status: Never   Smokeless tobacco: Never  Vaping Use   Vaping Use: Never used  Substance Use Topics   Alcohol use: Yes    Comment: socially   Drug use: No   Current Outpatient Medications  Medication Sig Dispense Refill   amoxicillin-clavulanate (AUGMENTIN) 875-125 MG tablet Take 1 tablet by mouth every 12 (twelve) hours. 14 tablet 0   famotidine (PEPCID) 20 MG tablet Take 1 tablet (20 mg total) by mouth 2 (two) times daily. 30 tablet 0   fluticasone (FLONASE) 50 MCG/ACT nasal spray Place 2 sprays into both nostrils daily.     lisinopril (ZESTRIL) 20 MG  tablet Take 20 mg by mouth daily.     MAGNESIUM PO Take 1 tablet by mouth 3 (three) times daily.     Vitamin D, Ergocalciferol, (DRISDOL) 1.25 MG (50000 UNIT) CAPS capsule Take 1 capsule (50,000 Units total) by mouth every 7 (seven) days. 4 capsule 0   No current facility-administered medications for this visit.   No Known Allergies   Review of Systems: All systems reviewed and negative except where noted in HPI.    No results found.  Physical Exam: BP 124/82   Pulse 81   Ht 5\' 10"  (1.778 m)   Wt 232 lb 6 oz (105.4  kg)   BMI 33.34 kg/m  Constitutional: Pleasant,well-developed, Caucasian male in no acute distress. HEENT: Normocephalic and atraumatic. Conjunctivae are normal. No scleral icterus. Neck supple.  Cardiovascular: Normal rate, regular rhythm.  Pulmonary/chest: Effort normal and breath sounds normal. No wheezing, rales or rhonchi. Abdominal: Soft, nondistended, nontender. Bowel sounds active throughout.  Diastases recti present.  There are no masses palpable. No hepatomegaly. Extremities: no edema Neurological: Alert and oriented to person place and time. Skin: Skin is warm and dry. No rashes noted. Psychiatric: Normal mood and affect. Behavior is normal.  CBC    Component Value Date/Time   WBC 15.3 (H) 04/02/2022 1902   RBC 5.27 04/02/2022 1902   HGB 15.1 04/02/2022 1902   HGB 16.3 10/23/2021 1041   HCT 42.4 04/02/2022 1902   HCT 46.7 10/23/2021 1041   PLT 253 04/02/2022 1902   PLT 259 10/23/2021 1041   MCV 80.5 04/02/2022 1902   MCV 83 10/23/2021 1041   MCH 28.7 04/02/2022 1902   MCHC 35.6 04/02/2022 1902   RDW 13.3 04/02/2022 1902   RDW 13.4 10/23/2021 1041   LYMPHSABS 2.4 10/23/2021 1041   EOSABS 0.4 10/23/2021 1041   BASOSABS 0.1 10/23/2021 1041    CMP     Component Value Date/Time   NA 140 04/02/2022 1902   NA 140 06/07/2020 1125   K 3.3 (L) 04/02/2022 1902   CL 105 04/02/2022 1902   CO2 22 04/02/2022 1902   GLUCOSE 194 (H) 04/02/2022 1902   BUN 17 04/02/2022 1902   BUN 13 06/07/2020 1125   CREATININE 1.08 04/02/2022 1902   CALCIUM 9.7 04/02/2022 1902   PROT 6.8 04/02/2022 1902   PROT 7.4 06/07/2020 1125   ALBUMIN 3.5 04/02/2022 1902   ALBUMIN 4.2 06/07/2020 1125   AST 30 04/02/2022 1902   ALT 32 04/02/2022 1902   ALKPHOS 57 04/02/2022 1902   BILITOT 0.7 04/02/2022 1902   BILITOT 1.0 06/07/2020 1125   GFRNONAA >60 04/02/2022 1902   GFRAA 116 06/07/2020 1125     ASSESSMENT AND PLAN: 51 year old male with history of intermittent, mild GERD symptoms,  with episode of severe chest pain with diaphoresis concerning for cardiac etiology, however evaluation in the emergency department negative for any acute cardiopulmonary causes.  Given his symptoms occurred in the supine position and followed a heavy meal, I do favor GERD as a cause of his chest pain.  He is due for his initial average risk screening colonoscopy.  An upper endoscopy at that time to assess his anatomy and for complications of GERD, as well as to exclude other causes of upper abdominal pain would be reasonable.  Given his infrequent symptoms, I do not recommend he start a PPI at this time.  This will also help 44 to assess for for evidence of GERD in the absence of acid suppression. Although the  patient CT scan was suggestive of diverticulitis, the patient's symptoms did not seem consistent at all with diverticulitis.  I do not recommend any treatment for diverticulitis.  Will assess further at time of colonoscopy.  Chest pain, suspect GERD - EGD -Hold off on PPI until after EGD  CRC screening - Colonoscopy  The details, risks (including bleeding, perforation, infection, missed lesions, medication reactions and possible hospitalization or surgery if complications occur), benefits, and alternatives to EGD/colonoscopy with possible biopsy and possible polypectomy were discussed with the patient and he consents to proceed.   Michael Vazquez E. Tomasa Rand, MD [facility]   CC:  Daisy Floro, MD

## 2022-05-08 NOTE — Patient Instructions (Signed)
_______________________________________________________  If you are age 50 or older, your body mass index should be between 23-30. Your Body mass index is 33.34 kg/m. If this is out of the aforementioned range listed, please consider follow up with your Primary Care Provider.  If you are age 58 or younger, your body mass index should be between 19-25. Your Body mass index is 33.34 kg/m. If this is out of the aformentioned range listed, please consider follow up with your Primary Care Provider.   You have been scheduled for an endoscopy and colonoscopy. Please follow the written instructions given to you at your visit today. Please pick up your prep supplies at the pharmacy within the next 1-3 days. If you use inhalers (even only as needed), please bring them with you on the day of your procedure.    The [facility] providers would like to encourage you to use [facility] to communicate with providers for non-urgent requests or questions.  Due to long hold times on the telephone, sending your provider a message by [facility] may be a faster and more efficient way to get a response.  Please allow 48 business hours for a response.  Please remember that this is for non-urgent requests.   It was a pleasure to see you today!  Thank you for trusting me with your gastrointestinal care!    Scott E.Tomasa Rand, MD

## 2022-06-01 DIAGNOSIS — F4010 Social phobia, unspecified: Secondary | ICD-10-CM | POA: Diagnosis not present

## 2022-06-01 DIAGNOSIS — F422 Mixed obsessional thoughts and acts: Secondary | ICD-10-CM | POA: Diagnosis not present

## 2022-06-15 DIAGNOSIS — F4010 Social phobia, unspecified: Secondary | ICD-10-CM | POA: Diagnosis not present

## 2022-06-15 DIAGNOSIS — F422 Mixed obsessional thoughts and acts: Secondary | ICD-10-CM | POA: Diagnosis not present

## 2022-06-22 DIAGNOSIS — F4010 Social phobia, unspecified: Secondary | ICD-10-CM | POA: Diagnosis not present

## 2022-06-22 DIAGNOSIS — F422 Mixed obsessional thoughts and acts: Secondary | ICD-10-CM | POA: Diagnosis not present

## 2022-06-27 ENCOUNTER — Encounter: Payer: BC Managed Care – PPO | Admitting: Gastroenterology

## 2022-06-29 DIAGNOSIS — F4010 Social phobia, unspecified: Secondary | ICD-10-CM | POA: Diagnosis not present

## 2022-06-29 DIAGNOSIS — F422 Mixed obsessional thoughts and acts: Secondary | ICD-10-CM | POA: Diagnosis not present

## 2022-07-13 DIAGNOSIS — F422 Mixed obsessional thoughts and acts: Secondary | ICD-10-CM | POA: Diagnosis not present

## 2022-07-13 DIAGNOSIS — F4010 Social phobia, unspecified: Secondary | ICD-10-CM | POA: Diagnosis not present

## 2022-07-20 DIAGNOSIS — F4010 Social phobia, unspecified: Secondary | ICD-10-CM | POA: Diagnosis not present

## 2022-07-20 DIAGNOSIS — F422 Mixed obsessional thoughts and acts: Secondary | ICD-10-CM | POA: Diagnosis not present

## 2022-07-29 ENCOUNTER — Encounter: Payer: Self-pay | Admitting: Gastroenterology

## 2022-07-30 DIAGNOSIS — F422 Mixed obsessional thoughts and acts: Secondary | ICD-10-CM | POA: Diagnosis not present

## 2022-07-30 DIAGNOSIS — F4010 Social phobia, unspecified: Secondary | ICD-10-CM | POA: Diagnosis not present

## 2022-07-31 ENCOUNTER — Encounter (INDEPENDENT_AMBULATORY_CARE_PROVIDER_SITE_OTHER): Payer: Self-pay | Admitting: Adult Health

## 2022-07-31 ENCOUNTER — Ambulatory Visit (INDEPENDENT_AMBULATORY_CARE_PROVIDER_SITE_OTHER): Payer: BC Managed Care – PPO | Admitting: Adult Health

## 2022-07-31 VITALS — BP 148/88 | HR 64 | Temp 97.9°F | Ht 70.0 in | Wt 225.0 lb

## 2022-07-31 DIAGNOSIS — E559 Vitamin D deficiency, unspecified: Secondary | ICD-10-CM

## 2022-07-31 DIAGNOSIS — E669 Obesity, unspecified: Secondary | ICD-10-CM

## 2022-07-31 DIAGNOSIS — I10 Essential (primary) hypertension: Secondary | ICD-10-CM

## 2022-07-31 DIAGNOSIS — R7303 Prediabetes: Secondary | ICD-10-CM | POA: Diagnosis not present

## 2022-07-31 DIAGNOSIS — Z6832 Body mass index (BMI) 32.0-32.9, adult: Secondary | ICD-10-CM

## 2022-07-31 MED ORDER — VITAMIN D (ERGOCALCIFEROL) 1.25 MG (50000 UNIT) PO CAPS: 50000.0000 [IU] | ORAL_CAPSULE | ORAL | 0 refills | Status: DC

## 2022-07-31 NOTE — Progress Notes (Signed)
WEIGHT SUMMARY AND BIOMETRICS  Vitals Temp: 97.9 F (36.6 C) BP: (!) 148/88 Pulse Rate: 64 SpO2: 98 %   Anthropometric Measurements Height: '5\' 10"'$  (1.778 m) Weight: 225 lb (102.1 kg) BMI (Calculated): 32.28 Weight at Last Visit: 224lb Weight Lost Since Last Visit: +1lb Starting Weight: 237lb Total Weight Loss (lbs): 12 lb (5.443 kg)   Body Composition  Body Fat %: 30 % Fat Mass (lbs): 67.6 lbs Muscle Mass (lbs): 150 lbs Total Body Water (lbs): 110.6 lbs Visceral Fat Rating : 15   Other Clinical Data Fasting: no Labs: no Today's Visit #: 15 Starting Date: 06/07/20    Chief Complaint:   OBESITY Michael Vazquez is here to discuss his progress with his obesity treatment plan. He is on the the Category 3 Plan and states he is following his eating plan approximately 20 % of the time.  He states he is exercising walking 10 minutes 2 times per week.   Interim History:  Last HWW 02/05/2022 with Everardo Pacific, FNP He has been lost to f/u since then. ED visit for CP on 04/02/2022- ACS rules out, referred to GI. He has EGD and Colonoscopy scheduled for 08/06/2022.   He endorses intermittent GERD sx's.  He has been loosely following Cat 3 since last OV, he is VERY pleased to have maintained his weight.  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  He lives with her parents (father, 60- hx of A Fib, HTN, recovered from Hodgkin lymphoma  Mother, 4- chronic pain and HTN).  Subjective:   1. Vitamin D deficiency He has been off Ergocalciferol for months. He endorses increased fatigue.  2. Primary hypertension BP above goal. He did take morning Lisinopril '20mg'$  He endorses increased fatigue, therefore he consumed a strong energy drink just prior to OV. He denies CP or palpations at present.  3. Prediabetes  Latest Reference Range & Units 10/23/21 10:41 04/02/22 19:02  Glucose 70 - 99 mg/dL  194 (H)  Hemoglobin A1C 4.8 -  5.6 % 5.6   Est. average glucose Bld gHb Est-mCnc mg/dL 114   INSULIN 2.6 - 24.9 uIU/mL 10.9   (H): Data is abnormally high  Assessment/Plan:   1. Vitamin D deficiency Refill - Vitamin D, Ergocalciferol, (DRISDOL) 1.25 MG (50000 UNIT) CAPS capsule; Take 1 capsule (50,000 Units total) by mouth every 7 (seven) days.  Dispense: 4 capsule; Refill: 0 Check Labs at next OV.  2. Primary hypertension Check Labs at next OV. Limit energy drinks. Increase regular walking.  3. Prediabetes Check Labs at next OV. Restart Cat 3 meal plan.  4. Obesity, current BMI 32.28  Dal is not currently in the action stage of change. As such, his goal is to get back to weightloss efforts . He has agreed to the Category 3 Plan. -RESTART MEAL PLAN- all information sheets provided.  Exercise goals:  Walk at least 3 x week  Behavioral modification strategies: increasing lean protein intake, decreasing simple carbohydrates, increasing vegetables, increasing water intake, no skipping meals, meal planning and cooking strategies, emotional eating strategies, and planning for success.  Cleven has agreed to follow-up with our clinic in 4 weeks. He was informed of the importance of frequent follow-up visits to maximize his success with intensive lifestyle modifications for his multiple health conditions.   Fasting Labs and IC at next OV- pt aware to arrive 67mns early and fasting.  Objective:   Blood pressure (!) 148/88, pulse 64, temperature 97.9  F (36.6 C), height '5\' 10"'$  (1.778 m), weight 225 lb (102.1 kg), SpO2 98 %. Body mass index is 32.28 kg/m.  General: Cooperative, alert, well developed, in no acute distress. HEENT: Conjunctivae and lids unremarkable. Cardiovascular: Regular rhythm.  Lungs: Normal work of breathing. Neurologic: No focal deficits.   Lab Results  Component Value Date   CREATININE 1.08 04/02/2022   BUN 17 04/02/2022   NA 140 04/02/2022   K 3.3 (L) 04/02/2022   CL 105  04/02/2022   CO2 22 04/02/2022   Lab Results  Component Value Date   ALT 32 04/02/2022   AST 30 04/02/2022   ALKPHOS 57 04/02/2022   BILITOT 0.7 04/02/2022   Lab Results  Component Value Date   HGBA1C 5.6 10/23/2021   HGBA1C 5.7 (H) 06/07/2020   Lab Results  Component Value Date   INSULIN 10.9 10/23/2021   INSULIN 15.8 06/07/2020   Lab Results  Component Value Date   TSH 0.860 06/07/2020   Lab Results  Component Value Date   CHOL 193 10/23/2021   HDL 45 10/23/2021   LDLCALC 128 (H) 10/23/2021   TRIG 108 10/23/2021   Lab Results  Component Value Date   VD25OH 39.8 10/23/2021   VD25OH 9.6 (L) 06/07/2020   Lab Results  Component Value Date   WBC 15.3 (H) 04/02/2022   HGB 15.1 04/02/2022   HCT 42.4 04/02/2022   MCV 80.5 04/02/2022   PLT 253 04/02/2022   No results found for: "IRON", "TIBC", "FERRITIN"  Attestation Statements:   Reviewed by clinician on day of visit: allergies, medications, problem list, medical history, surgical history, family history, social history, and previous encounter notes.  I have reviewed the above documentation for accuracy and completeness, and I agree with the above. -  Niomi Valent d. Seeley Southgate, NP-C

## 2022-08-03 DIAGNOSIS — F422 Mixed obsessional thoughts and acts: Secondary | ICD-10-CM | POA: Diagnosis not present

## 2022-08-03 DIAGNOSIS — F4010 Social phobia, unspecified: Secondary | ICD-10-CM | POA: Diagnosis not present

## 2022-08-06 ENCOUNTER — Ambulatory Visit (AMBULATORY_SURGERY_CENTER): Payer: BC Managed Care – PPO | Admitting: Gastroenterology

## 2022-08-06 ENCOUNTER — Encounter: Payer: Self-pay | Admitting: Gastroenterology

## 2022-08-06 ENCOUNTER — Other Ambulatory Visit: Payer: Self-pay | Admitting: Gastroenterology

## 2022-08-06 VITALS — BP 124/82 | HR 73 | Resp 15 | Ht 70.0 in | Wt 225.0 lb

## 2022-08-06 DIAGNOSIS — Z1212 Encounter for screening for malignant neoplasm of rectum: Secondary | ICD-10-CM | POA: Diagnosis not present

## 2022-08-06 DIAGNOSIS — Z1211 Encounter for screening for malignant neoplasm of colon: Secondary | ICD-10-CM

## 2022-08-06 DIAGNOSIS — K319 Disease of stomach and duodenum, unspecified: Secondary | ICD-10-CM | POA: Diagnosis not present

## 2022-08-06 DIAGNOSIS — F422 Mixed obsessional thoughts and acts: Secondary | ICD-10-CM | POA: Diagnosis not present

## 2022-08-06 DIAGNOSIS — K2980 Duodenitis without bleeding: Secondary | ICD-10-CM | POA: Diagnosis not present

## 2022-08-06 DIAGNOSIS — F4010 Social phobia, unspecified: Secondary | ICD-10-CM | POA: Diagnosis not present

## 2022-08-06 DIAGNOSIS — K269 Duodenal ulcer, unspecified as acute or chronic, without hemorrhage or perforation: Secondary | ICD-10-CM | POA: Diagnosis not present

## 2022-08-06 DIAGNOSIS — R079 Chest pain, unspecified: Secondary | ICD-10-CM

## 2022-08-06 MED ORDER — SODIUM CHLORIDE 0.9 % IV SOLN: 500.0000 mL | INTRAVENOUS | Status: DC

## 2022-08-06 MED ORDER — PANTOPRAZOLE SODIUM 40 MG PO TBEC: 40.0000 mg | DELAYED_RELEASE_TABLET | Freq: Two times a day (BID) | ORAL | 0 refills | Status: DC

## 2022-08-06 NOTE — Op Note (Signed)
[facility] Patient Name: Michael Vazquez Procedure Date: 08/06/2022 9:24 AM MRN: [id number] Endoscopist: [place]. Candis Schatz , MD, [id number] Age: 52 Referring MD:  Date of Birth: 30-Jun-1970 Gender: Male Account #: [id number] Procedure:                Colonoscopy Indications:              Screening for colorectal malignant neoplasm, This                            is the patient's first colonoscopy Medicines:                Monitored Anesthesia Care Procedure:                Pre-Anesthesia Assessment:                           - Prior to the procedure, a History and Physical                            was performed, and patient medications and                            allergies were reviewed. The patient's tolerance of                            previous anesthesia was also reviewed. The risks                            and benefits of the procedure and the sedation                            options and risks were discussed with the patient.                            All questions were answered, and informed consent                            was obtained. Prior Anticoagulants: The patient has                            taken no anticoagulant or antiplatelet agents. ASA                            Grade Assessment: II - A patient with mild systemic                            disease. After reviewing the risks and benefits,                            the patient was deemed in satisfactory condition to                            undergo the procedure.  After obtaining informed consent, the colonoscope                            was passed under direct vision. Throughout the                            procedure, the patient's blood pressure, pulse, and                            oxygen saturations were monitored continuously. The                            CF [id number] was introduced through the anus                            and advanced to the  the cecum, identified by                            appendiceal orifice and ileocecal valve. The                            colonoscopy was performed without difficulty. The                            patient tolerated the procedure well. The quality                            of the bowel preparation was good. The ileocecal                            valve, appendiceal orifice, and rectum were                            photographed. Scope In: 9:39:06 AM Scope Out: 9:53:02 AM Scope Withdrawal Time: 0 hours 11 minutes 8 seconds  Total Procedure Duration: 0 hours 13 minutes 56 seconds  Findings:                 The perianal and digital rectal examinations were                            normal. Pertinent negatives include normal                            sphincter tone and no palpable rectal lesions.                           Multiple medium-mouthed and small-mouthed                            diverticula were found in the sigmoid colon,                            descending colon and ascending colon. There was no  evidence of diverticular bleeding.                           The exam was otherwise normal throughout the                            examined colon.                           The retroflexed view of the distal rectum and anal                            verge was normal and showed no anal or rectal                            abnormalities. Complications:            No immediate complications. Estimated Blood Loss:     Estimated blood loss: none. Impression:               - Moderate diverticulosis in the sigmoid colon, in                            the descending colon and in the ascending colon.                            There was no evidence of diverticular bleeding.                           - The distal rectum and anal verge are normal on                            retroflexion view.                           - No specimens collected.                            - The GI Genius (intelligent endoscopy module),                            computer-aided polyp detection system powered by AI                            was utilized to detect colorectal polyps through                            enhanced visualization during colonoscopy. Recommendation:           - Patient has a contact number available for                            emergencies. The signs and symptoms of potential                            delayed complications were discussed with the  patient. Return to normal activities tomorrow.                            Written discharge instructions were provided to the                            patient.                           - Resume previous diet.                           - Continue present medications.                           - Repeat colonoscopy in 10 years for screening                            purposes. Larita Deremer E. Candis Schatz, MD 08/06/2022 10:05:16 AM This report has been signed electronically.

## 2022-08-06 NOTE — Progress Notes (Signed)
[facility] History and Physical   Primary Care Physician:  Lawerance Cruel, MD   Reason for Procedure:   Chest pain, colon cancer screening  Plan:    EGD, colonoscopy     HPI: Michael Vazquez is a 52 y.o. male undergoing initial average risk screening colonoscopy.  He also has had recurring chest pain symptoms prompting ED visit in November.  He has had recurring chest pain of lesser severity about once a week.  He has no family history of colon cancer and no chronic lower GI symptoms.    Past Medical History:  Diagnosis Date   Chest pain    GERD (gastroesophageal reflux disease)    Hernia    Hypercholesteremia    Hypertension     Past Surgical History:  Procedure Laterality Date   INGUINAL HERNIA REPAIR Left 2010   recurrent [facility] _ Dr Margot Chimes open w mesh   LAPAROSCOPIC INGUINAL HERNIA REPAIR Bilateral 2001   Dr Deon Pilling    Prior to Admission medications   Medication Sig Start Date End Date Taking? Authorizing Provider  lisinopril (ZESTRIL) 20 MG tablet Take 20 mg by mouth daily.   Yes [provider]  fluticasone (FLONASE) 50 MCG/ACT nasal spray Place 2 sprays into both nostrils daily. 01/29/22   [provider]  ibuprofen (ADVIL) 800 MG tablet Take 800 mg by mouth 3 (three) times daily. 04/09/22   [provider]  MAGNESIUM PO Take 1 tablet by mouth 3 (three) times daily.    [provider]  Vitamin D, Ergocalciferol, (DRISDOL) 1.25 MG (50000 UNIT) CAPS capsule Take 1 capsule (50,000 Units total) by mouth every 7 (seven) days. 07/31/22   Mina Marble D, NP    Current Outpatient Medications  Medication Sig Dispense Refill   lisinopril (ZESTRIL) 20 MG tablet Take 20 mg by mouth daily.     fluticasone (FLONASE) 50 MCG/ACT nasal spray Place 2 sprays into both nostrils daily.     ibuprofen (ADVIL) 800 MG tablet Take 800 mg by mouth 3 (three) times daily.     MAGNESIUM PO Take 1 tablet by mouth 3 (three) times daily.      Vitamin D, Ergocalciferol, (DRISDOL) 1.25 MG (50000 UNIT) CAPS capsule Take 1 capsule (50,000 Units total) by mouth every 7 (seven) days. 4 capsule 0   Current Facility-Administered Medications  Medication Dose Route Frequency Provider Last Rate Last Admin   0.9 %  sodium chloride infusion  500 mL Intravenous Continuous Daryel November, MD        Allergies as of 08/06/2022   (No Known Allergies)    Family History  Problem Relation Age of Onset   High blood pressure Mother    High Cholesterol Mother    Obesity Mother    High blood pressure Father    Heart disease Father    Cancer Father    Colon cancer Neg Hx    Colon polyps Neg Hx    Esophageal cancer Neg Hx    Rectal cancer Neg Hx    Stomach cancer Neg Hx     Social History   Socioeconomic History   Marital status: Single    Spouse name: Not on file   Number of children: 0   Years of education: 2 associate's degrees   Highest education level: Associate degree: occupational, Hotel manager, or vocational program  Occupational History   Occupation: customer service-Tech support    Employer: [employer]    Comment: call center  Tobacco Use  Smoking status: Never   Smokeless tobacco: Never  Vaping Use   Vaping Use: Never used  Substance and Sexual Activity   Alcohol use: Yes    Comment: socially   Drug use: No   Sexual activity: Not on file  Other Topics Concern   Not on file  Social History Narrative   Lives with his parents.   Social Determinants of Health   Financial Resource Strain: Not on file  Food Insecurity: Not on file  Transportation Needs: Not on file  Physical Activity: Not on file  Stress: Not on file  Social Connections: Not on file  Intimate Partner Violence: Not on file    Review of Systems:  All other review of systems negative except as mentioned in the HPI.  Physical Exam: Vital signs BP 132/84   Pulse 82   Ht 5\' 10"  (1.778 m)   Wt 225 lb (102.1 kg)   SpO2 96%   BMI 32.28  kg/m   General:   Alert,  Well-developed, well-nourished, pleasant and cooperative in NAD Airway:  Mallampati 2 Lungs:  Clear throughout to auscultation.   Heart:  Regular rate and rhythm; no murmurs, clicks, rubs,  or gallops. Abdomen:  Soft, nontender and nondistended. Normal bowel sounds.   Neuro/Psych:  Normal mood and affect. A and O x 3   Salahuddin Arismendez E. Candis Schatz, MD [facility]

## 2022-08-06 NOTE — Progress Notes (Signed)
Called to room to assist during endoscopic procedure.  Patient ID and intended procedure confirmed with present staff. Received instructions for my participation in the procedure from the performing physician.  

## 2022-08-06 NOTE — Patient Instructions (Addendum)
Resume previous diet(see attached information for GERD diet) Continue present medications AND Pick up new Rx for Protonix(Pantoprazole), TAKE TWICE DAILY 30 MINUTES BEFORE A MEAL FOR 8 WEEKS TO HEAL DUODENAL ULCERS. AVOID use of  NSAIDS (Non-Steroidal anti-inflammatory drugs).  (These include, aspirin, aspirin-containing products(products containing salicylic acid like Pepto Bismol and Alka Seltzer), ibuprofen, advil, motrin, naproxen, aleve, goody powders, etc) Tylenol is ok to take as needed, see label for instructions. There were no polyps seen today!  You will need another screening colonoscopy in 10 years, you will receive a letter at that time when you are due for the procedure.   Please call us at [phone] if you have a change in bowel habits, change in family history of colo-rectal cancer, rectal bleeding or other GI concern before that time. Handouts given for GERD, hiatal hernia, esophagitis and diverticulosis.  YOU HAD AN ENDOSCOPIC PROCEDURE TODAY AT [facility]:   Refer to the procedure report that was given to you for any specific questions about what was found during the examination.  If the procedure report does not answer your questions, please call your gastroenterologist to clarify.  If you requested that your care partner not be given the details of your procedure findings, then the procedure report has been included in a sealed envelope for you to review at your convenience later.  YOU SHOULD EXPECT: Some feelings of bloating in the abdomen. Passage of more gas than usual.  Walking can help get rid of the air that was put into your GI tract during the procedure and reduce the bloating. If you had a lower endoscopy (such as a colonoscopy or flexible sigmoidoscopy) you may notice spotting of blood in your stool or on the toilet paper. If you underwent a bowel prep for your procedure, you may not have a normal bowel movement for a few days. Please Note:  You might  notice some irritation and congestion in your nose or some drainage.  This is from the oxygen used during your procedure.  There is no need for concern and it should clear up in a day or so.  SYMPTOMS TO REPORT IMMEDIATELY: Following lower endoscopy (colonoscopy or flexible sigmoidoscopy):  Excessive amounts of blood in the stool  Significant tenderness or worsening of abdominal pains  Swelling of the abdomen that is new, acute  Fever of 100F or higher Following upper endoscopy (EGD)  Vomiting of blood or coffee ground material  New chest pain or pain under the shoulder blades  Painful or persistently difficult swallowing  New shortness of breath  Black, tarry-looking stools For urgent or emergent issues, a gastroenterologist can be reached at any hour by calling [phone]. Do not use MyChart messaging for urgent concerns.   DIET:  We do recommend a small meal at first, but then you may proceed to your regular diet.  Drink plenty of fluids but you should avoid alcoholic beverages for 24 hours.  ACTIVITY:  You should plan to take it easy for the rest of today and you should NOT DRIVE or use heavy machinery until tomorrow (because of the sedation medicines used during the test).    FOLLOW UP: Our staff will call the number listed on your records the next business day following your procedure.  We will call around 7:15- 8:00 am to check on you and address any questions or concerns that you may have regarding the information given to you following your procedure. If we do not reach you,  we will leave a message.     If any biopsies were taken you will be contacted by phone or by letter within the next 1-3 weeks.  Please call us at [phone] if you have not heard about the biopsies in 3 weeks.   SIGNATURES/CONFIDENTIALITY: You and/or your care partner have signed paperwork which will be entered into your electronic medical record.  These signatures attest to the fact that that the  information above on your After Visit Summary has been reviewed and is understood.  Full responsibility of the confidentiality of this discharge information lies with you and/or your care-partner.

## 2022-08-06 NOTE — Op Note (Signed)
[place] Patient Name: Akhilesh Sparr Procedure Date: 08/06/2022 9:24 AM MRN: [id number] Endoscopist: [place]. Candis Schatz , MD, [id number] Age: 52 Referring MD:  Date of Birth: Aug 12, 1970 Gender: Male Account #: [id number] Procedure:                Upper GI endoscopy Indications:              Suspected esophageal reflux, Chest pain (non                            cardiac) Medicines:                Monitored Anesthesia Care Procedure:                Pre-Anesthesia Assessment:                           - Prior to the procedure, a History and Physical                            was performed, and patient medications and                            allergies were reviewed. The patient's tolerance of                            previous anesthesia was also reviewed. The risks                            and benefits of the procedure and the sedation                            options and risks were discussed with the patient.                            All questions were answered, and informed consent                            was obtained. Prior Anticoagulants: The patient has                            taken no anticoagulant or antiplatelet agents. ASA                            Grade Assessment: II - A patient with mild systemic                            disease. After reviewing the risks and benefits,                            the patient was deemed in satisfactory condition to                            undergo the procedure.  After obtaining informed consent, the endoscope was                            passed under direct vision. Throughout the                            procedure, the patient's blood pressure, pulse, and                            oxygen saturations were monitored continuously. The                            Olympus scope [id number] was introduced through the                            mouth, and advanced to the third part of  duodenum.                            The upper GI endoscopy was accomplished without                            difficulty. The patient tolerated the procedure                            well. Scope In: Scope Out: Findings:                 The examined portions of the nasopharynx,                            oropharynx and larynx were normal.                           LA Grade A (one or more mucosal breaks less than 5                            mm, not extending between tops of 2 mucosal folds)                            esophagitis with no bleeding was found at the                            gastroesophageal junction.                           The exam of the esophagus was otherwise normal.                           A few localized diminutive erosions with no                            bleeding and no stigmata of recent bleeding were                            found in  the prepyloric region of the stomach.                            Biopsies were taken with a cold forceps for                            Helicobacter pylori testing. Estimated blood loss                            was minimal.                           A 2 cm hiatal hernia was present.                           The exam of the stomach was otherwise normal.                           A few erosions without bleeding were found in the                            second portion of the duodenum. Biopsies were taken                            with a cold forceps for histology. Estimated blood                            loss was minimal.                           The exam of the duodenum was otherwise normal. Complications:            No immediate complications. Estimated Blood Loss:     Estimated blood loss was minimal. Impression:               - The examined portions of the nasopharynx,                            oropharynx and larynx were normal.                           - LA Grade A reflux esophagitis with no bleeding.                            - Erosive gastropathy with no bleeding and no                            stigmata of recent bleeding. Biopsied.                           - 2 cm hiatal hernia.                           - Duodenal erosions without bleeding. Biopsied. Recommendation:           - Patient has a  contact number available for                            emergencies. The signs and symptoms of potential                            delayed complications were discussed with the                            patient. Return to normal activities tomorrow.                            Written discharge instructions were provided to the                            patient.                           - Resume previous diet.                           - Continue present medications.                           - Await pathology results.                           - Use Protonix (pantoprazole) 40 mg PO BID for 8                            weeks.                           - Avoid aspirin, ibuprofen, naproxen, or other                            non-steroidal anti-inflammatory drugs. Romelia Bromell E. Candis Schatz, MD 08/06/2022 10:01:31 AM This report has been signed electronically.

## 2022-08-06 NOTE — Progress Notes (Signed)
Sedate, gd SR, tolerated procedure well, VSS, report to RN 

## 2022-08-07 ENCOUNTER — Telehealth: Payer: Self-pay | Admitting: *Deleted

## 2022-08-07 NOTE — Telephone Encounter (Signed)
Left message on f/u call 

## 2022-08-14 NOTE — Progress Notes (Signed)
Mr. Michael Vazquez,  The biopsies taken from your stomach were notable for mild reactive gastropathy which is a common finding and often related to use of certain medications (usually NSAIDs), but there was no evidence of Helicobacter pylori infection.  The biopsies of your duodenum showed inflammatory changes (duodenitis with ulceration), which is most commonly due to NSAIDs or stomach acid. Please take the pantoprazole every day for 8 weeks as recommended and avoid taking NSAIDs.  Use Tylenol preferentially to treat pain/headaches.

## 2022-08-17 DIAGNOSIS — F422 Mixed obsessional thoughts and acts: Secondary | ICD-10-CM | POA: Diagnosis not present

## 2022-08-17 DIAGNOSIS — F4010 Social phobia, unspecified: Secondary | ICD-10-CM | POA: Diagnosis not present

## 2022-09-02 ENCOUNTER — Other Ambulatory Visit: Payer: Self-pay | Admitting: Gastroenterology

## 2022-09-03 ENCOUNTER — Ambulatory Visit (INDEPENDENT_AMBULATORY_CARE_PROVIDER_SITE_OTHER): Payer: BC Managed Care – PPO | Admitting: Adult Health

## 2022-09-03 DIAGNOSIS — F422 Mixed obsessional thoughts and acts: Secondary | ICD-10-CM | POA: Diagnosis not present

## 2022-09-03 DIAGNOSIS — F4010 Social phobia, unspecified: Secondary | ICD-10-CM | POA: Diagnosis not present

## 2022-09-10 ENCOUNTER — Ambulatory Visit (INDEPENDENT_AMBULATORY_CARE_PROVIDER_SITE_OTHER): Payer: BC Managed Care – PPO | Admitting: Adult Health

## 2022-09-10 DIAGNOSIS — F422 Mixed obsessional thoughts and acts: Secondary | ICD-10-CM | POA: Diagnosis not present

## 2022-09-10 DIAGNOSIS — F4010 Social phobia, unspecified: Secondary | ICD-10-CM | POA: Diagnosis not present

## 2022-09-21 DIAGNOSIS — F422 Mixed obsessional thoughts and acts: Secondary | ICD-10-CM | POA: Diagnosis not present

## 2022-09-21 DIAGNOSIS — F4010 Social phobia, unspecified: Secondary | ICD-10-CM | POA: Diagnosis not present

## 2022-10-01 ENCOUNTER — Ambulatory Visit (INDEPENDENT_AMBULATORY_CARE_PROVIDER_SITE_OTHER): Payer: BC Managed Care – PPO | Admitting: Adult Health

## 2022-10-01 DIAGNOSIS — F4010 Social phobia, unspecified: Secondary | ICD-10-CM | POA: Diagnosis not present

## 2022-10-01 DIAGNOSIS — F422 Mixed obsessional thoughts and acts: Secondary | ICD-10-CM | POA: Diagnosis not present

## 2022-10-15 DIAGNOSIS — F422 Mixed obsessional thoughts and acts: Secondary | ICD-10-CM | POA: Diagnosis not present

## 2022-10-15 DIAGNOSIS — F4010 Social phobia, unspecified: Secondary | ICD-10-CM | POA: Diagnosis not present

## 2022-10-22 ENCOUNTER — Ambulatory Visit (INDEPENDENT_AMBULATORY_CARE_PROVIDER_SITE_OTHER): Payer: BC Managed Care – PPO | Admitting: Adult Health

## 2022-10-26 DIAGNOSIS — F4010 Social phobia, unspecified: Secondary | ICD-10-CM | POA: Diagnosis not present

## 2022-10-26 DIAGNOSIS — F422 Mixed obsessional thoughts and acts: Secondary | ICD-10-CM | POA: Diagnosis not present

## 2022-10-29 ENCOUNTER — Ambulatory Visit: Payer: BC Managed Care – PPO | Admitting: Podiatry

## 2022-11-23 DIAGNOSIS — F4010 Social phobia, unspecified: Secondary | ICD-10-CM | POA: Diagnosis not present

## 2022-11-23 DIAGNOSIS — F422 Mixed obsessional thoughts and acts: Secondary | ICD-10-CM | POA: Diagnosis not present

## 2022-12-02 DIAGNOSIS — H6123 Impacted cerumen, bilateral: Secondary | ICD-10-CM | POA: Diagnosis not present

## 2022-12-07 DIAGNOSIS — F422 Mixed obsessional thoughts and acts: Secondary | ICD-10-CM | POA: Diagnosis not present

## 2022-12-07 DIAGNOSIS — F4010 Social phobia, unspecified: Secondary | ICD-10-CM | POA: Diagnosis not present

## 2022-12-17 DIAGNOSIS — F4010 Social phobia, unspecified: Secondary | ICD-10-CM | POA: Diagnosis not present

## 2022-12-17 DIAGNOSIS — F422 Mixed obsessional thoughts and acts: Secondary | ICD-10-CM | POA: Diagnosis not present

## 2023-01-07 DIAGNOSIS — L304 Erythema intertrigo: Secondary | ICD-10-CM | POA: Diagnosis not present

## 2023-01-07 DIAGNOSIS — B360 Pityriasis versicolor: Secondary | ICD-10-CM | POA: Diagnosis not present

## 2023-01-07 DIAGNOSIS — L74510 Primary focal hyperhidrosis, axilla: Secondary | ICD-10-CM | POA: Diagnosis not present

## 2023-01-08 ENCOUNTER — Encounter (INDEPENDENT_AMBULATORY_CARE_PROVIDER_SITE_OTHER): Payer: Self-pay | Admitting: Family Medicine

## 2023-01-08 ENCOUNTER — Telehealth (INDEPENDENT_AMBULATORY_CARE_PROVIDER_SITE_OTHER): Payer: BC Managed Care – PPO | Admitting: Family Medicine

## 2023-01-08 DIAGNOSIS — E559 Vitamin D deficiency, unspecified: Secondary | ICD-10-CM

## 2023-01-08 DIAGNOSIS — E669 Obesity, unspecified: Secondary | ICD-10-CM | POA: Diagnosis not present

## 2023-01-08 DIAGNOSIS — Z6832 Body mass index (BMI) 32.0-32.9, adult: Secondary | ICD-10-CM

## 2023-01-08 DIAGNOSIS — I10 Essential (primary) hypertension: Secondary | ICD-10-CM | POA: Diagnosis not present

## 2023-01-08 MED ORDER — VITAMIN D (ERGOCALCIFEROL) 1.25 MG (50000 UNIT) PO CAPS: 50000.0000 [IU] | ORAL_CAPSULE | ORAL | 0 refills | Status: AC

## 2023-01-08 MED ORDER — LISINOPRIL 20 MG PO TABS: 20.0000 mg | ORAL_TABLET | Freq: Every day | ORAL | 0 refills | Status: DC

## 2023-01-08 NOTE — Progress Notes (Signed)
TeleHealth Visit:  This visit was completed with telemedicine (audio/video) technology. Michael Vazquez has verbally consented to this TeleHealth visit. The patient is located at home, the provider is located at home. The participants in this visit include the listed provider and patient. The visit was conducted today via MyChart video.  OBESITY Michael Vazquez is here to discuss his progress with his obesity treatment plan along with follow-up of his obesity related diagnoses.   Today's visit was # 16 Starting weight: 237 lbs Starting date: 06/07/20 Weight at last in office visit: 225 lbs on 07/31/22 Total weight loss: 12 lbs at last in office visit on 07/31/22. Today's reported weight (01/08/23): none reported  Nutrition Plan: the Category 3 plan - 0% adherence.  Current exercise: none   Interim History:  Follow-up has been sporadic.  He had a visit on 01/26/2022 but then did not follow-up again until 07/31/2022, and then no follow-up until today. He has tried to follow the plan but is tempted by foods in vending machine at work. He tends to opt for foods that are quick and easy. He lives with his parents and does some of the cooking. He knows he needs to attend the clinic more regularly to make progress with weight loss.  He struggles more with cravings than hunger.  Typical day of food: Breakfast: 2 english muffins with butter or oatmeal Lunch: Ramen noodles Dinner: pasta or frozen meals Snacks: vending machine snacks  He is quite deficient in protein intake.  Has never exercised regularly in adult life.  Assessment/Plan:  1. Hypertension Hypertension control uncertain.  Medication(s): Lisinopril 20 mg daily.  Has been off of this for a few months due to lack of follow-up.  BP Readings from Last 3 Encounters:  08/06/22 124/82  07/31/22 (!) 148/88  05/08/22 124/82   Lab Results  Component Value Date   CREATININE 1.08 04/02/2022   CREATININE 0.90 06/07/2020   CREATININE 0.83  09/20/2017   No results found for: "GFR"  Plan: Restart lisinopril 20 mg daily.  90-day refill sent to pharmacy.  2. Vitamin D Deficiency Vitamin D is not at goal of 50.  Most recent vitamin D level was 39 on 10/23/2021.Michael Vazquez He is prescribed ergocalciferol 50,000 IU weekly but has been off of the medication for few months due to lack of follow-up. Lab Results  Component Value Date   VD25OH 39.8 10/23/2021   VD25OH 9.6 (L) 06/07/2020    Plan: Start  prescription ergocalciferol 50,000 IU weekly Check vitamin D level next office visit.  3. Generalized Obesity: Current BMI 32  Michael Vazquez is currently in the action stage of change. As such, his goal is to continue with weight loss efforts.  He has agreed to the Category 3 plan.  1.  Discussed substituting light English muffins and adding a few eggs to this.  Also may try protein oatmeal made with fair life skim milk. 2.  Recommended sandwich with 4 ounces of lunch meat or frozen meal for lunch. 3.  He will resume category 3 to the best of his ability until follow-up with William Hamburger NP in office. 4.  Discussed importance of frequent follow-up for best success.  Exercise goals: No exercise has been prescribed at this time.  Behavioral modification strategies: increasing lean protein intake, decreasing simple carbohydrates , meal planning , and planning for success.  Michael Vazquez has agreed to follow-up with our clinic in 5 weeks with fasting labs and IC.  No orders of the defined types were placed in this  encounter.   Medications Discontinued During This Encounter  Medication Reason   lisinopril (ZESTRIL) 20 MG tablet Reorder   Vitamin D, Ergocalciferol, (DRISDOL) 1.25 MG (50000 UNIT) CAPS capsule Reorder     Meds ordered this encounter  Medications   lisinopril (ZESTRIL) 20 MG tablet    Sig: Take 1 tablet (20 mg total) by mouth daily.    Dispense:  90 tablet    Refill:  0    Order Specific Question:   Supervising Provider    Answer:    Seymour Bars E [[id number]]   Vitamin D, Ergocalciferol, (DRISDOL) 1.25 MG (50000 UNIT) CAPS capsule    Sig: Take 1 capsule (50,000 Units total) by mouth every 7 (seven) days.    Dispense:  4 capsule    Refill:  0    Order Specific Question:   Supervising Provider    Answer:   Glennis Brink [[id number]]      Objective:   VITALS: Per patient if applicable, see vitals. GENERAL: Alert and in no acute distress. CARDIOPULMONARY: No increased WOB. Speaking in clear sentences.  PSYCH: Pleasant and cooperative. Speech normal rate and rhythm. Affect is appropriate. Insight and judgement are appropriate. Attention is focused, linear, and appropriate.  NEURO: Oriented as arrived to appointment on time with no prompting.   Attestation Statements:   Reviewed by clinician on day of visit: allergies, medications, problem list, medical history, surgical history, family history, social history, and previous encounter notes.   This was prepared with the assistance of Engineer, civil (consulting).  Occasional wrong-word or sound-a-like substitutions may have occurred due to the inherent limitations of voice recognition software.

## 2023-01-11 DIAGNOSIS — F4010 Social phobia, unspecified: Secondary | ICD-10-CM | POA: Diagnosis not present

## 2023-01-11 DIAGNOSIS — F422 Mixed obsessional thoughts and acts: Secondary | ICD-10-CM | POA: Diagnosis not present

## 2023-01-15 ENCOUNTER — Ambulatory Visit: Payer: BC Managed Care – PPO | Admitting: Podiatry

## 2023-01-21 DIAGNOSIS — F422 Mixed obsessional thoughts and acts: Secondary | ICD-10-CM | POA: Diagnosis not present

## 2023-01-21 DIAGNOSIS — F4010 Social phobia, unspecified: Secondary | ICD-10-CM | POA: Diagnosis not present

## 2023-02-11 ENCOUNTER — Ambulatory Visit (INDEPENDENT_AMBULATORY_CARE_PROVIDER_SITE_OTHER): Payer: BC Managed Care – PPO | Admitting: Adult Health

## 2023-02-22 DIAGNOSIS — F4010 Social phobia, unspecified: Secondary | ICD-10-CM | POA: Diagnosis not present

## 2023-02-22 DIAGNOSIS — F422 Mixed obsessional thoughts and acts: Secondary | ICD-10-CM | POA: Diagnosis not present

## 2023-03-01 DIAGNOSIS — F4010 Social phobia, unspecified: Secondary | ICD-10-CM | POA: Diagnosis not present

## 2023-03-01 DIAGNOSIS — F422 Mixed obsessional thoughts and acts: Secondary | ICD-10-CM | POA: Diagnosis not present

## 2023-03-08 DIAGNOSIS — F422 Mixed obsessional thoughts and acts: Secondary | ICD-10-CM | POA: Diagnosis not present

## 2023-03-08 DIAGNOSIS — F4010 Social phobia, unspecified: Secondary | ICD-10-CM | POA: Diagnosis not present

## 2023-03-15 DIAGNOSIS — F4010 Social phobia, unspecified: Secondary | ICD-10-CM | POA: Diagnosis not present

## 2023-03-15 DIAGNOSIS — F422 Mixed obsessional thoughts and acts: Secondary | ICD-10-CM | POA: Diagnosis not present

## 2023-03-22 DIAGNOSIS — F422 Mixed obsessional thoughts and acts: Secondary | ICD-10-CM | POA: Diagnosis not present

## 2023-03-22 DIAGNOSIS — F4010 Social phobia, unspecified: Secondary | ICD-10-CM | POA: Diagnosis not present

## 2023-04-05 DIAGNOSIS — F4010 Social phobia, unspecified: Secondary | ICD-10-CM | POA: Diagnosis not present

## 2023-04-05 DIAGNOSIS — F422 Mixed obsessional thoughts and acts: Secondary | ICD-10-CM | POA: Diagnosis not present

## 2023-04-12 DIAGNOSIS — F422 Mixed obsessional thoughts and acts: Secondary | ICD-10-CM | POA: Diagnosis not present

## 2023-04-12 DIAGNOSIS — F4010 Social phobia, unspecified: Secondary | ICD-10-CM | POA: Diagnosis not present

## 2023-04-15 DIAGNOSIS — L304 Erythema intertrigo: Secondary | ICD-10-CM | POA: Diagnosis not present

## 2023-04-15 DIAGNOSIS — B360 Pityriasis versicolor: Secondary | ICD-10-CM | POA: Diagnosis not present

## 2023-04-15 DIAGNOSIS — B079 Viral wart, unspecified: Secondary | ICD-10-CM | POA: Diagnosis not present

## 2023-04-15 DIAGNOSIS — L74510 Primary focal hyperhidrosis, axilla: Secondary | ICD-10-CM | POA: Diagnosis not present

## 2023-04-15 DIAGNOSIS — D492 Neoplasm of unspecified behavior of bone, soft tissue, and skin: Secondary | ICD-10-CM | POA: Diagnosis not present

## 2023-04-15 DIAGNOSIS — L538 Other specified erythematous conditions: Secondary | ICD-10-CM | POA: Diagnosis not present

## 2023-04-15 DIAGNOSIS — L820 Inflamed seborrheic keratosis: Secondary | ICD-10-CM | POA: Diagnosis not present

## 2023-04-16 ENCOUNTER — Encounter: Payer: Self-pay | Admitting: Podiatry

## 2023-04-16 ENCOUNTER — Ambulatory Visit (INDEPENDENT_AMBULATORY_CARE_PROVIDER_SITE_OTHER): Payer: BC Managed Care – PPO | Admitting: Podiatry

## 2023-04-16 VITALS — Ht 70.0 in | Wt 225.0 lb

## 2023-04-16 DIAGNOSIS — M722 Plantar fascial fibromatosis: Secondary | ICD-10-CM

## 2023-04-16 NOTE — Progress Notes (Signed)
Subjective:  Patient ID: Michael Vazquez, male    DOB: 05-18-1971,  MRN: [id number]  Chief Complaint  Patient presents with   Foot Pain    Pt is here due to bilateral foot pain on the arch of his feet that's been there for years, states hurts more after a day of standing and walking. No injury to foot, pt has been wearing Dr Doug Sou inserts and arch brace on both feet, states it gives him some slight relief.    52 y.o. male presents with the above complaint.  Patient presents with bilateral heel pain that has been going for quite some time is progressive gotten worse worse with ambulation worse with pressure patient would like for me to discuss treatment options for it.  Has tried over-the-counter options including Dr. Margart Sickles.  Pain scale 7 out of 10 dull aching nature   Review of Systems: Negative except as noted in the HPI. Denies N/V/F/Ch.  Past Medical History:  Diagnosis Date   Chest pain    GERD (gastroesophageal reflux disease)    Hernia    Hypercholesteremia    Hypertension     Current Outpatient Medications:    fluticasone (FLONASE) 50 MCG/ACT nasal spray, Place 2 sprays into both nostrils daily., Disp: , Rfl:    ibuprofen (ADVIL) 800 MG tablet, Take 800 mg by mouth 3 (three) times daily., Disp: , Rfl:    lisinopril (ZESTRIL) 20 MG tablet, Take 1 tablet (20 mg total) by mouth daily., Disp: 90 tablet, Rfl: 0   MAGNESIUM PO, Take 1 tablet by mouth 3 (three) times daily., Disp: , Rfl:    pantoprazole (PROTONIX) 40 MG tablet, TAKE 1 TABLET (40 MG TOTAL) BY MOUTH TWICE A DAY BEFORE MEALS, Disp: 60 tablet, Rfl: 1   Vitamin D, Ergocalciferol, (DRISDOL) 1.25 MG (50000 UNIT) CAPS capsule, Take 1 capsule (50,000 Units total) by mouth every 7 (seven) days., Disp: 4 capsule, Rfl: 0  Social History   Tobacco Use  Smoking Status Never  Smokeless Tobacco Never    No Known Allergies Objective:  There were no vitals filed for this visit. Body mass index is 32.28  kg/m. Constitutional Well developed. Well nourished.  Vascular Dorsalis pedis pulses palpable bilaterally. Posterior tibial pulses palpable bilaterally. Capillary refill normal to all digits.  No cyanosis or clubbing noted. Pedal hair growth normal.  Neurologic Normal speech. Oriented to person, place, and time. Epicritic sensation to light touch grossly present bilaterally.  Dermatologic Nails well groomed and normal in appearance. No open wounds. No skin lesions.  Orthopedic: Normal joint ROM without pain or crepitus bilaterally. No visible deformities. Tender to palpation at the calcaneal tuber bilaterally. No pain with calcaneal squeeze bilaterally. Ankle ROM diminished range of motion bilaterally. Silfverskiold Test: positive bilaterally.   Radiographs: None  Assessment:   1. Plantar fasciitis of right foot   2. Plantar fasciitis of left foot    Plan:  Patient was evaluated and treated and all questions answered.  Plantar Fasciitis, bilaterally - XR reviewed as above.  - Educated on icing and stretching. Instructions given.  - Injection delivered to the plantar fascia as below. - DME: Plantar fascial brace dispensed to support the medial longitudinal arch of the foot and offload pressure from the heel and prevent arch collapse during weightbearing - Pharmacologic management: None  Procedure: Injection Tendon/Ligament Location: Bilateral plantar fascia at the glabrous junction; medial approach. Skin Prep: alcohol Injectate: 0.5 cc 0.5% marcaine plain, 0.5 cc of 1% Lidocaine, 0.5 cc kenalog 10.  Disposition: Patient tolerated procedure well. Injection site dressed with a band-aid.  No follow-ups on file.

## 2023-04-21 ENCOUNTER — Ambulatory Visit: Payer: BC Managed Care – PPO | Admitting: Podiatry

## 2023-04-26 DIAGNOSIS — F4010 Social phobia, unspecified: Secondary | ICD-10-CM | POA: Diagnosis not present

## 2023-04-26 DIAGNOSIS — F422 Mixed obsessional thoughts and acts: Secondary | ICD-10-CM | POA: Diagnosis not present

## 2023-05-07 DIAGNOSIS — F4010 Social phobia, unspecified: Secondary | ICD-10-CM | POA: Diagnosis not present

## 2023-05-07 DIAGNOSIS — F422 Mixed obsessional thoughts and acts: Secondary | ICD-10-CM | POA: Diagnosis not present

## 2023-05-12 DIAGNOSIS — M2669 Other specified disorders of temporomandibular joint: Secondary | ICD-10-CM | POA: Diagnosis not present

## 2023-05-12 DIAGNOSIS — H9202 Otalgia, left ear: Secondary | ICD-10-CM | POA: Diagnosis not present

## 2023-05-12 DIAGNOSIS — H6123 Impacted cerumen, bilateral: Secondary | ICD-10-CM | POA: Diagnosis not present

## 2023-05-12 DIAGNOSIS — H903 Sensorineural hearing loss, bilateral: Secondary | ICD-10-CM | POA: Diagnosis not present

## 2023-05-13 DIAGNOSIS — F4010 Social phobia, unspecified: Secondary | ICD-10-CM | POA: Diagnosis not present

## 2023-05-13 DIAGNOSIS — F422 Mixed obsessional thoughts and acts: Secondary | ICD-10-CM | POA: Diagnosis not present

## 2023-05-20 DIAGNOSIS — F422 Mixed obsessional thoughts and acts: Secondary | ICD-10-CM | POA: Diagnosis not present

## 2023-05-20 DIAGNOSIS — F4010 Social phobia, unspecified: Secondary | ICD-10-CM | POA: Diagnosis not present

## 2023-06-04 ENCOUNTER — Encounter: Payer: Self-pay | Admitting: Podiatry

## 2023-06-04 ENCOUNTER — Ambulatory Visit (INDEPENDENT_AMBULATORY_CARE_PROVIDER_SITE_OTHER): Payer: BC Managed Care – PPO | Admitting: Podiatry

## 2023-06-04 DIAGNOSIS — Q666 Other congenital valgus deformities of feet: Secondary | ICD-10-CM

## 2023-06-04 DIAGNOSIS — M722 Plantar fascial fibromatosis: Secondary | ICD-10-CM

## 2023-06-04 NOTE — Progress Notes (Signed)
 Subjective:  Patient ID: Michael Vazquez, male    DOB: 02/08/1971,  MRN: [id number]  Chief Complaint  Patient presents with   Plantar Fasciitis    RM#13 Follow up after injections to both feet states only relieved pain for about 1 week.    53 y.o. male presents with the above complaint.  Patient presents with bilateral plantar fasciitis he states that it hurts the injection helped a little bit lasted for a week but his pain started coming back slowly.  He would like to discuss orthotics as well as another set of injections  Review of Systems: Negative except as noted in the HPI. Denies N/V/F/Ch.  Past Medical History:  Diagnosis Date   Chest pain    GERD (gastroesophageal reflux disease)    Hernia    Hypercholesteremia    Hypertension     Current Outpatient Medications:    fluticasone (FLONASE) 50 MCG/ACT nasal spray, Place 2 sprays into both nostrils daily., Disp: , Rfl:    ibuprofen (ADVIL) 800 MG tablet, Take 800 mg by mouth 3 (three) times daily., Disp: , Rfl:    lisinopril  (ZESTRIL ) 20 MG tablet, Take 1 tablet (20 mg total) by mouth daily., Disp: 90 tablet, Rfl: 0   MAGNESIUM PO, Take 1 tablet by mouth 3 (three) times daily., Disp: , Rfl:    pantoprazole  (PROTONIX ) 40 MG tablet, TAKE 1 TABLET (40 MG TOTAL) BY MOUTH TWICE A DAY BEFORE MEALS, Disp: 60 tablet, Rfl: 1   Vitamin D , Ergocalciferol , (DRISDOL ) 1.25 MG (50000 UNIT) CAPS capsule, Take 1 capsule (50,000 Units total) by mouth every 7 (seven) days., Disp: 4 capsule, Rfl: 0  Social History   Tobacco Use  Smoking Status Never  Smokeless Tobacco Never    No Known Allergies Objective:  There were no vitals filed for this visit. There is no height or weight on file to calculate BMI. Constitutional Well developed. Well nourished.  Vascular Dorsalis pedis pulses palpable bilaterally. Posterior tibial pulses palpable bilaterally. Capillary refill normal to all digits.  No cyanosis or clubbing noted. Pedal hair growth  normal.  Neurologic Normal speech. Oriented to person, place, and time. Epicritic sensation to light touch grossly present bilaterally.  Dermatologic Nails well groomed and normal in appearance. No open wounds. No skin lesions.  Orthopedic: Normal joint ROM without pain or crepitus bilaterally. No visible deformities. Tender to palpation at the calcaneal tuber bilaterally. No pain with calcaneal squeeze bilaterally. Ankle ROM diminished range of motion bilaterally. Silfverskiold Test: positive bilaterally.   Radiographs: None  Assessment:   1. Plantar fasciitis of right foot   2. Plantar fasciitis of left foot   3. Pes planovalgus     Plan:  Patient was evaluated and treated and all questions answered.  Plantar Fasciitis, bilaterally - XR reviewed as above.  - Educated on icing and stretching. Instructions given.  -Second injection delivered to the plantar fascia as below. - DME: Plantar fascial brace dispensed to support the medial longitudinal arch of the foot and offload pressure from the heel and prevent arch collapse during weightbearing - Pharmacologic management: None  Pes planovalgus -I explained to patient the etiology of pes planovalgus and relationship with Planter fasciitis and various treatment options were discussed.  Given patient foot structure in the setting of Planter fasciitis I believe patient will benefit from custom-made orthotics to help control the hindfoot motion support the arch of the foot and take the stress away from plantar fascial.  Patient agrees with the plan like to  proceed with orthotics -Patient was casted for orthotics   Procedure: Injection Tendon/Ligament Location: Bilateral plantar fascia at the glabrous junction; medial approach. Skin Prep: alcohol Injectate: 0.5 cc 0.5% marcaine plain, 0.5 cc of 1% Lidocaine , 0.5 cc kenalog 10. Disposition: Patient tolerated procedure well. Injection site dressed with a band-aid.  No follow-ups on  file.

## 2023-06-07 DIAGNOSIS — F4010 Social phobia, unspecified: Secondary | ICD-10-CM | POA: Diagnosis not present

## 2023-06-07 DIAGNOSIS — F422 Mixed obsessional thoughts and acts: Secondary | ICD-10-CM | POA: Diagnosis not present

## 2023-06-11 NOTE — Progress Notes (Signed)
Orthotic order placed items to be fit when in  [employer], CFo, CFm

## 2023-06-14 DIAGNOSIS — F422 Mixed obsessional thoughts and acts: Secondary | ICD-10-CM | POA: Diagnosis not present

## 2023-06-14 DIAGNOSIS — F4010 Social phobia, unspecified: Secondary | ICD-10-CM | POA: Diagnosis not present

## 2023-06-18 ENCOUNTER — Ambulatory Visit: Payer: BC Managed Care – PPO | Admitting: Podiatry

## 2023-06-21 DIAGNOSIS — F422 Mixed obsessional thoughts and acts: Secondary | ICD-10-CM | POA: Diagnosis not present

## 2023-06-21 DIAGNOSIS — F4010 Social phobia, unspecified: Secondary | ICD-10-CM | POA: Diagnosis not present

## 2023-06-24 DIAGNOSIS — F4010 Social phobia, unspecified: Secondary | ICD-10-CM | POA: Diagnosis not present

## 2023-06-24 DIAGNOSIS — F422 Mixed obsessional thoughts and acts: Secondary | ICD-10-CM | POA: Diagnosis not present

## 2023-06-25 ENCOUNTER — Encounter (INDEPENDENT_AMBULATORY_CARE_PROVIDER_SITE_OTHER): Payer: Self-pay | Admitting: Physician Assistant

## 2023-06-25 ENCOUNTER — Ambulatory Visit (INDEPENDENT_AMBULATORY_CARE_PROVIDER_SITE_OTHER): Payer: BC Managed Care – PPO | Admitting: Physician Assistant

## 2023-06-25 VITALS — BP 152/83 | HR 62 | Temp 98.1°F | Ht 70.0 in | Wt 239.0 lb

## 2023-06-25 DIAGNOSIS — E7800 Pure hypercholesterolemia, unspecified: Secondary | ICD-10-CM

## 2023-06-25 DIAGNOSIS — R7303 Prediabetes: Secondary | ICD-10-CM

## 2023-06-25 DIAGNOSIS — Z6834 Body mass index (BMI) 34.0-34.9, adult: Secondary | ICD-10-CM

## 2023-06-25 DIAGNOSIS — E559 Vitamin D deficiency, unspecified: Secondary | ICD-10-CM

## 2023-06-25 DIAGNOSIS — G479 Sleep disorder, unspecified: Secondary | ICD-10-CM | POA: Diagnosis not present

## 2023-06-25 DIAGNOSIS — E669 Obesity, unspecified: Secondary | ICD-10-CM

## 2023-06-25 DIAGNOSIS — I10 Essential (primary) hypertension: Secondary | ICD-10-CM | POA: Diagnosis not present

## 2023-06-25 MED ORDER — LISINOPRIL 20 MG PO TABS: 20.0000 mg | ORAL_TABLET | Freq: Every day | ORAL | 0 refills | Status: AC

## 2023-06-25 NOTE — Progress Notes (Signed)
 SUBJECTIVE: Discussed the use of AI scribe software for clinical note transcription with the patient, who gave verbal consent to proceed.  Chief Complaint: Obesity  Interim History: Last video visit with [facility] 01/08/23- Prior to that in office 07/31/2022 with Rockie Dalton, NP.   Michael Vazquez is a 53 year old male with obesity who presents for follow-up on his treatment plan. Up 14 lbs since last in office visit on 07/31/2022.   He has not adhered to his obesity treatment plan and has gained approximately 14 pounds, with 10 pounds being adipose mass and 4 pounds being muscle mass.  Work-related stress and scheduling issues have contributed to his inability to focus on his treatment plan, leading to missed appointments. He wants to get back on track with his weight management and is relieved that his weight gain was not as much as he initially thought.  He has been prescribed lisinopril  for hypertension but forgets to take it regularly. He currently has some lisinopril  but may need a reorder to ensure consistent use. His blood pressure today was noted to be 152/83, which is above the target range.  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 Lack of sleep affects him significantly.  He is not currently taking any other medications and has not been exercising. He has not seen his primary care doctor in the past year. He is not fasting today and has not had recent labs checked since 2022.  Michael is here to discuss his progress with his obesity treatment plan. He is on the Category 3 Plan and states he is not following his eating plan approximately 0 % of the time. He states he is not exercising 0 minutes 0 times per week.   OBJECTIVE: Visit Diagnoses: Problem List Items Addressed This Visit     Prediabetes   Vitamin D  deficiency   Primary hypertension - Primary   Relevant Medications   lisinopril   (ZESTRIL ) 20 MG tablet   Other Visit Diagnoses       Sleep disturbance         Pure hypercholesterolemia       Relevant Medications   lisinopril  (ZESTRIL ) 20 MG tablet     Obesity, Starting BMI 34.01         BMI 34.0-34.9,adult Current BMI 34.3         Obesity Karsten Vazquez is seen for follow-up on his obesity treatment plan after nearly a year since his last in-office visit. He has gained 14 pounds, with 10 pounds being adipose tissue and 4 pounds being muscle mass. He has not been able to focus on the plan due to work and family commitments. Discussed the importance of focusing on nutrition and eventually incorporating exercise to optimize health.  - Reinstate obesity treatment plan- Starting on Category 3 plan which he was previously on until we have more information/repeat IC  - Provide grocery list for nutritional guidance and Cat. 3 nutrition plan - Schedule follow-up visit for fasting labs and metabolic study on March 11 at 10 AM - Advise fasting for 8 hours and avoiding caffeine before the next visit  Hypertension Blood pressure today was 152/83, above the goal of less than 130/80. He has not been consistently taking his lisinopril . Discussed the importance of regular medication adherence and potential genetic predisposition to hypertension. Explained that extra weight and stress can also contribute to elevated blood pressure. -  Reorder lisinopril  - Advise placing medication near toothbrush or using a pill container to improve adherence - Monitor blood pressure regularly   Prediabetes Last A1c was 5.6- nearing goal  Medication(s): None Polyphagia:Yes- at times and with stress.  Lab Results  Component Value Date   HGBA1C 5.6 10/23/2021   HGBA1C 5.7 (H) 06/07/2020   Lab Results  Component Value Date   INSULIN  10.9 10/23/2021   INSULIN  15.8 06/07/2020    Plan:  He is going to get back on track with his nutrition plan and will begin Cat. 3 plan.  Will plan to repeat  fasting labs next visit.   Vitamin D  Deficiency Vitamin D  is not at goal of 50.  Most recent vitamin D  level was 39.8. He is on no supplementation currently. Lab Results  Component Value Date   VD25OH 39.8 10/23/2021   VD25OH 9.6 (L) 06/07/2020    Plan:  Will plan to recheck vitamin D  levels next visit and supplementation as indicated.  Low vitamin D  levels can be associated with adiposity and may result in leptin resistance and weight gain. Also associated with fatigue. Currently on vitamin D  supplementation without any adverse effects.    Hyperlipidemia LDL is not at goal. Medication(s): None Cardiovascular risk factors: dyslipidemia, hypertension, male gender, obesity (BMI >= 30 kg/m2), and sedentary lifestyle  Lab Results  Component Value Date   CHOL 193 10/23/2021   HDL 45 10/23/2021   LDLCALC 128 (H) 10/23/2021   TRIG 108 10/23/2021   Lab Results  Component Value Date   ALT 32 04/02/2022   AST 30 04/02/2022   ALKPHOS 57 04/02/2022   BILITOT 0.7 04/02/2022   The 10-year ASCVD risk score (Arnett DK, et al., 2019) is: 6.4%   Values used to calculate the score:     Age: 14 years     Sex: Male     Is Non-Hispanic African American: No     Diabetic: No     Tobacco smoker: No     Systolic Blood Pressure: 152 mmHg     Is BP treated: Yes     HDL Cholesterol: 45 mg/dL     Total Cholesterol: 193 mg/dL  Plan:  Resume working on nutrition plan -decreasing simple carbohydrates, increasing lean proteins, decreasing saturated fats and cholesterol , avoiding trans fats and exercise as able to promote weight loss, improve lipids and decrease cardiovascular risks. Will plan to obtain fasting lipid panel next visit     Sleep Disturbance Reports poor sleep for the past 3-4 months, likely due to work-related stress. He is scheduled for an evaluation at the [facility] in March. Poor sleep can impact overall health and blood pressure.  - Proceed with sleep  evaluation at [facility] - Consider potential sleep study based on initial evaluation results  General Health Maintenance No recent labs or primary care visits in the past year. Last metabolic study was in 2022. Discussed the importance of regular health check-ups and lab work to monitor overall health. - Order fasting labs including lipids, A1c, and insulin  levels at next visit - Evaluate need for vitamin D  and B12 supplementation based on lab results  Follow-up - Follow-up visit on March 11 at 10 AM - Arrive at 9:30 AM for metabolic test.  Vitals Temp: 98.1 F (36.7 C) BP: (!) 152/83 Pulse Rate: 62 SpO2: 95 %   Anthropometric Measurements Height: 5' 10 (1.778 m) Weight: 239 lb (108.4 kg) BMI (Calculated): 34.29 Weight at Last  Visit: 225 lb Weight Lost Since Last Visit: 0 Weight Gained Since Last Visit: 14 lb Starting Weight: 237 lb Total Weight Loss (lbs): 26 lb (11.8 kg)   Body Composition  Body Fat %: 32.3 % Fat Mass (lbs): 77.2 lbs Muscle Mass (lbs): 154 lbs Total Body Water (lbs): 110 lbs Visceral Fat Rating : 17   Other Clinical Data Fasting: no Labs: no Today's Visit #: 17 Starting Date: 06/07/20     ASSESSMENT AND PLAN:  Diet: Amous is currently in the action stage of change. As such, his goal is to get back to weight loss efforts. He has agreed to Category 3 Plan.  Exercise: Keisean has been instructed that some exercise is better than none for weight loss and overall health benefits.   Behavior Modification:  We discussed the following Behavioral Modification Strategies today: increasing lean protein intake, decreasing simple carbohydrates, increasing vegetables, increase H2O intake, increase high fiber foods, no skipping meals, meal planning and cooking strategies, keeping healthy foods in the home, better snacking choices, avoiding temptations, and planning for success. We discussed various medication options to help Tory with  his weight loss efforts and we both agreed to get back on track with his nutrition plan and resume to work on nutritional and behavioral strategies to promote weight loss.  .  Return in about 5 weeks (around 07/30/2023) for Fasting Lab, Fasting IC.SABRAPatient was directed to come in 30 minutes prior to scheduled appointment to complete IC testing.   He was informed of the importance of frequent follow up visits to maximize his success with intensive lifestyle modifications for his multiple health conditions.  Attestation Statements:   Reviewed by clinician on day of visit: allergies, medications, problem list, medical history, surgical history, family history, social history, and previous encounter notes.   Time spent on visit including pre-visit chart review and post-visit care and charting was 33 minutes.    Imagine Nest, PA-C

## 2023-06-27 ENCOUNTER — Telehealth: Payer: Self-pay

## 2023-06-27 NOTE — Telephone Encounter (Signed)
 Left V[facility] saying that his orthotics came in and he can pick them up with Dr Lydia Sams on the 19th during his appt

## 2023-07-01 DIAGNOSIS — F422 Mixed obsessional thoughts and acts: Secondary | ICD-10-CM | POA: Diagnosis not present

## 2023-07-01 DIAGNOSIS — F4010 Social phobia, unspecified: Secondary | ICD-10-CM | POA: Diagnosis not present

## 2023-07-05 DIAGNOSIS — F422 Mixed obsessional thoughts and acts: Secondary | ICD-10-CM | POA: Diagnosis not present

## 2023-07-05 DIAGNOSIS — F4010 Social phobia, unspecified: Secondary | ICD-10-CM | POA: Diagnosis not present

## 2023-07-09 ENCOUNTER — Ambulatory Visit (INDEPENDENT_AMBULATORY_CARE_PROVIDER_SITE_OTHER): Payer: BC Managed Care – PPO | Admitting: Podiatry

## 2023-07-09 DIAGNOSIS — M722 Plantar fascial fibromatosis: Secondary | ICD-10-CM

## 2023-07-09 NOTE — Progress Notes (Signed)
  Subjective:  Patient ID: Michael Vazquez, male    DOB: Oct 03, 1970,  MRN: [id number]  Chief Complaint  Patient presents with   Plantar Fasciitis    Pt stated that he is doing better has not really had any issues since the last injection     53 y.o. male presents with the above complaint.  Patient presents with follow-up of bilateral plantar fasciitis he states doing a lot better.  He is 90% improved.  He is here to pick up his orthotics as well  Review of Systems: Negative except as noted in the HPI. Denies N/V/F/Ch.  Past Medical History:  Diagnosis Date   Chest pain    GERD (gastroesophageal reflux disease)    Hernia    Hypercholesteremia    Hypertension     Current Outpatient Medications:    DRYSOL 20 % external solution, Apply topically at bedtime., Disp: , Rfl:    fluticasone (FLONASE) 50 MCG/ACT nasal spray, Place 2 sprays into both nostrils daily., Disp: , Rfl:    ibuprofen (ADVIL) 800 MG tablet, Take 800 mg by mouth 3 (three) times daily., Disp: , Rfl:    lisinopril (ZESTRIL) 20 MG tablet, Take 1 tablet (20 mg total) by mouth daily., Disp: 90 tablet, Rfl: 0   MAGNESIUM PO, Take 1 tablet by mouth 3 (three) times daily., Disp: , Rfl:    pantoprazole (PROTONIX) 40 MG tablet, TAKE 1 TABLET (40 MG TOTAL) BY MOUTH TWICE A DAY BEFORE MEALS, Disp: 60 tablet, Rfl: 1   Vitamin D, Ergocalciferol, (DRISDOL) 1.25 MG (50000 UNIT) CAPS capsule, Take 1 capsule (50,000 Units total) by mouth every 7 (seven) days., Disp: 4 capsule, Rfl: 0  Social History   Tobacco Use  Smoking Status Never  Smokeless Tobacco Never    No Known Allergies Objective:  There were no vitals filed for this visit. There is no height or weight on file to calculate BMI. Constitutional Well developed. Well nourished.  Vascular Dorsalis pedis pulses palpable bilaterally. Posterior tibial pulses palpable bilaterally. Capillary refill normal to all digits.  No cyanosis or clubbing noted. Pedal hair growth  normal.  Neurologic Normal speech. Oriented to person, place, and time. Epicritic sensation to light touch grossly present bilaterally.  Dermatologic Nails well groomed and normal in appearance. No open wounds. No skin lesions.  Orthopedic: Normal joint ROM without pain or crepitus bilaterally. No visible deformities. No further tender to palpation at the calcaneal tuber bilaterally. No pain with calcaneal squeeze bilaterally. Ankle ROM diminished range of motion bilaterally. Silfverskiold Test: positive bilaterally.   Radiographs: None  Assessment:   No diagnosis found.   Plan:  Patient was evaluated and treated and all questions answered.  Plantar Fasciitis, bilaterally -Clinically healed.  At this time patient is officially discharged from my care I discussed shoe gear modification.  I also discussed orthotics he states understanding  Pes planovalgus -I explained to patient the etiology of pes planovalgus and relationship with Planter fasciitis and various treatment options were discussed.  Given patient foot structure in the setting of Planter fasciitis I believe patient will benefit from custom-made orthotics to help control the hindfoot motion support the arch of the foot and take the stress away from plantar fascial.  Patient agrees with the plan like to proceed with orthotics -Orthotics were dispensed functioning well    No follow-ups on file.

## 2023-07-15 DIAGNOSIS — F4010 Social phobia, unspecified: Secondary | ICD-10-CM | POA: Diagnosis not present

## 2023-07-15 DIAGNOSIS — F422 Mixed obsessional thoughts and acts: Secondary | ICD-10-CM | POA: Diagnosis not present

## 2023-07-22 ENCOUNTER — Institutional Professional Consult (permissible substitution): Payer: BC Managed Care – PPO | Admitting: Primary Care

## 2023-07-26 DIAGNOSIS — F422 Mixed obsessional thoughts and acts: Secondary | ICD-10-CM | POA: Diagnosis not present

## 2023-07-26 DIAGNOSIS — F4010 Social phobia, unspecified: Secondary | ICD-10-CM | POA: Diagnosis not present

## 2023-07-29 ENCOUNTER — Ambulatory Visit (INDEPENDENT_AMBULATORY_CARE_PROVIDER_SITE_OTHER): Payer: BC Managed Care – PPO | Admitting: Physician Assistant

## 2023-08-02 DIAGNOSIS — F422 Mixed obsessional thoughts and acts: Secondary | ICD-10-CM | POA: Diagnosis not present

## 2023-08-02 DIAGNOSIS — F4010 Social phobia, unspecified: Secondary | ICD-10-CM | POA: Diagnosis not present

## 2023-08-04 DIAGNOSIS — F4010 Social phobia, unspecified: Secondary | ICD-10-CM | POA: Diagnosis not present

## 2023-08-04 DIAGNOSIS — F422 Mixed obsessional thoughts and acts: Secondary | ICD-10-CM | POA: Diagnosis not present

## 2023-08-05 ENCOUNTER — Ambulatory Visit: Payer: BC Managed Care – PPO | Admitting: Primary Care

## 2023-08-05 ENCOUNTER — Encounter: Payer: Self-pay | Admitting: Primary Care

## 2023-08-05 VITALS — BP 138/82 | HR 100 | Temp 96.8°F | Ht 70.0 in | Wt 247.4 lb

## 2023-08-05 DIAGNOSIS — Z9189 Other specified personal risk factors, not elsewhere classified: Secondary | ICD-10-CM | POA: Diagnosis not present

## 2023-08-05 DIAGNOSIS — G4719 Other hypersomnia: Secondary | ICD-10-CM

## 2023-08-05 NOTE — Patient Instructions (Addendum)
  Sleep apnea is defined as period of 10 seconds or longer when you stop breathing at night. This can happen multiple times a night. Dx sleep apnea is when this occurs more than 5 times an hour.    Mild OSA 5-15 apneic events an hour Moderate OSA 15-30 apneic events an hour Severe OSA > 30 apneic events an hour   Untreated sleep apnea puts you at higher risk for cardiac arrhythmias, pulmonary HTN, stroke and diabetes  Treatment options include weight loss, side sleeping position, oral appliance, CPAP therapy or referral to ENT for possible surgical options    Recommendations: Focus on side sleeping position or elevate head with wedge pillow 30 degrees Work on weight loss efforts if able  Do not drive if experiencing excessive daytime sleepiness of fatigue    Orders: Recheck BP, if remains elevated follow up with PCP  Home sleep study re: loud snoring    Follow-up: Please call to schedule follow-up 2-3 weeks after completing home sleep study to review results and treatment if needed (can be virtual)

## 2023-08-05 NOTE — Progress Notes (Signed)
 @Patient  ID: Michael Vazquez, male    DOB: 09/25/1970, 53 y.o.   MRN: [id number]  Chief Complaint  Patient presents with   Follow-up    Referring provider: Daisy Floro, MD  HPI: 53 year old, never smoked. PMH significant for primary HTN, obesity, pre-diabetes.   08/05/2023 Discussed the use of AI scribe software for clinical note transcription with the patient, who gave verbal consent to proceed.  Michael Vazquez is a 53 year old male who presents with difficulty maintaining sleep. He has been experiencing difficulty maintaining sleep for at least four months, waking up one to five times a night, typically one to two hours after initially falling asleep, and then struggling to return to sleep. This results in significant daytime fatigue, impacting his ability to function at work. Despite feeling tired, the pattern of waking persists nightly.  His typical bedtime is between 11 PM and 12 AM, and it takes him about 15 to 30 minutes to fall asleep. He frequently wakes during the night, often to use the restroom, and starts his day early at 4:30 AM due to work commitments, resulting in only four to five hours of sleep per night. He feels tired throughout the day, with an Epworth Sleepiness Scale score of 13 out of 24, indicating moderate sleepiness. No history of waking up gasping or choking, sleepwalking, or experiencing sudden sleep attacks. He is unsure if he currently snores, although he has been told he snored in the past.  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 He typically sleeps on his side or stomach. No family history of sleep apnea.      No Known Allergies   There is no immunization history on file for this patient.  Past Medical History:  Diagnosis Date   Chest pain    GERD (gastroesophageal reflux disease)    Hernia    Hypercholesteremia    Hypertension     Tobacco History: Social  History   Tobacco Use  Smoking Status Never  Smokeless Tobacco Never   Counseling given: Not Answered   Outpatient Medications Prior to Visit  Medication Sig Dispense Refill   lisinopril (ZESTRIL) 20 MG tablet Take 1 tablet (20 mg total) by mouth daily. 90 tablet 0   Vitamin D, Ergocalciferol, (DRISDOL) 1.25 MG (50000 UNIT) CAPS capsule Take 1 capsule (50,000 Units total) by mouth every 7 (seven) days. 4 capsule 0   DRYSOL 20 % external solution Apply topically at bedtime. (Patient not taking: Reported on 08/05/2023)     fluticasone (FLONASE) 50 MCG/ACT nasal spray Place 2 sprays into both nostrils daily. (Patient not taking: Reported on 08/05/2023)     ibuprofen (ADVIL) 800 MG tablet Take 800 mg by mouth 3 (three) times daily. (Patient not taking: Reported on 08/05/2023)     MAGNESIUM PO Take 1 tablet by mouth 3 (three) times daily. (Patient not taking: Reported on 08/05/2023)     pantoprazole (PROTONIX) 40 MG tablet TAKE 1 TABLET (40 MG TOTAL) BY MOUTH TWICE A DAY BEFORE MEALS (Patient not taking: Reported on 08/05/2023) 60 tablet 1   No facility-administered medications prior to visit.      Review of Systems  Review of Systems  Constitutional:  Positive for fatigue.  HENT: Negative.    Respiratory: Negative.    Cardiovascular: Negative.     Physical Exam  BP (!) 146/94 (BP Location: Right Arm, Patient Position: Sitting, Cuff  Size: Large)   Pulse 100   Temp (!) 96.8 F (36 C) (Temporal)   Ht 5\' 10"  (1.778 m)   Wt 247 lb 6.4 oz (112.2 kg)   SpO2 95%   BMI 35.50 kg/m  Physical Exam Constitutional:      General: He is not in acute distress.    Appearance: Normal appearance. He is obese. He is not ill-appearing.  HENT:     Head: Normocephalic and atraumatic.     Mouth/Throat:     Mouth: Mucous membranes are moist.     Pharynx: Oropharynx is clear.     Comments: Mallampati class III Cardiovascular:     Rate and Rhythm: Normal rate and regular rhythm.  Pulmonary:      Effort: Pulmonary effort is normal.     Breath sounds: No wheezing, rhonchi or rales.  Musculoskeletal:        General: Normal range of motion.  Skin:    General: Skin is warm and dry.  Neurological:     General: No focal deficit present.     Mental Status: He is alert and oriented to person, place, and time. Mental status is at baseline.  Psychiatric:        Mood and Affect: Mood normal.        Behavior: Behavior normal.        Thought Content: Thought content normal.        Judgment: Judgment normal.      Lab Results:  CBC    Component Value Date/Time   WBC 15.3 (H) 04/02/2022 1902   RBC 5.27 04/02/2022 1902   HGB 15.1 04/02/2022 1902   HGB 16.3 10/23/2021 1041   HCT 42.4 04/02/2022 1902   HCT 46.7 10/23/2021 1041   PLT 253 04/02/2022 1902   PLT 259 10/23/2021 1041   MCV 80.5 04/02/2022 1902   MCV 83 10/23/2021 1041   MCH 28.7 04/02/2022 1902   MCHC 35.6 04/02/2022 1902   RDW 13.3 04/02/2022 1902   RDW 13.4 10/23/2021 1041   LYMPHSABS 2.4 10/23/2021 1041   EOSABS 0.4 10/23/2021 1041   BASOSABS 0.1 10/23/2021 1041    BMET    Component Value Date/Time   NA 140 04/02/2022 1902   NA 140 06/07/2020 1125   K 3.3 (L) 04/02/2022 1902   CL 105 04/02/2022 1902   CO2 22 04/02/2022 1902   GLUCOSE 194 (H) 04/02/2022 1902   BUN 17 04/02/2022 1902   BUN 13 06/07/2020 1125   CREATININE 1.08 04/02/2022 1902   CALCIUM 9.7 04/02/2022 1902   GFRNONAA >60 04/02/2022 1902   GFRAA 116 06/07/2020 1125    BNP    Component Value Date/Time   BNP 12.1 04/02/2022 1902    ProBNP No results found for: "PROBNP"  Imaging: No results found.   Assessment & Plan:   1. At risk for sleep apnea (Primary) - Home sleep test; Future  2. Excessive daytime sleepiness - Home sleep test; Future     Suspected Obstructive Sleep Apnea Patient reports excessive daytime sleepiness and restless sleep. Unsure if he snores. Symptoms and BMI suggest obstructive sleep apnea. We reviewed  risks of untreated sleep apnea include cardiac arrhythmia, atrial fibrillation, hypertension, stroke, pulmonary hypertension, and diabetes. - Order home sleep study over three nights with [facility]. - Discuss potential treatment options if confirmed, including weight loss, positional therapy, CPAP, dental appliances, and ENT referral for surgical options.  Insomnia Patient has trouble maintaining sleep, he is only getting 4-5 hours  of sleep per night. Associated daytime sleepiness. Work-related stress and potential sleep apnea considered. - Recommend earlier bedtime to achieve 6-8 hours of sleep per night.      Glenford Bayley, NP 08/05/2023

## 2023-08-09 DIAGNOSIS — F4010 Social phobia, unspecified: Secondary | ICD-10-CM | POA: Diagnosis not present

## 2023-08-09 DIAGNOSIS — F422 Mixed obsessional thoughts and acts: Secondary | ICD-10-CM | POA: Diagnosis not present

## 2023-08-12 DIAGNOSIS — F422 Mixed obsessional thoughts and acts: Secondary | ICD-10-CM | POA: Diagnosis not present

## 2023-08-12 DIAGNOSIS — F4010 Social phobia, unspecified: Secondary | ICD-10-CM | POA: Diagnosis not present

## 2023-08-16 DIAGNOSIS — F4010 Social phobia, unspecified: Secondary | ICD-10-CM | POA: Diagnosis not present

## 2023-08-16 DIAGNOSIS — F422 Mixed obsessional thoughts and acts: Secondary | ICD-10-CM | POA: Diagnosis not present

## 2023-08-19 ENCOUNTER — Ambulatory Visit (INDEPENDENT_AMBULATORY_CARE_PROVIDER_SITE_OTHER): Admitting: Physician Assistant

## 2023-08-19 DIAGNOSIS — F422 Mixed obsessional thoughts and acts: Secondary | ICD-10-CM | POA: Diagnosis not present

## 2023-08-19 DIAGNOSIS — F4010 Social phobia, unspecified: Secondary | ICD-10-CM | POA: Diagnosis not present

## 2023-08-23 DIAGNOSIS — F4010 Social phobia, unspecified: Secondary | ICD-10-CM | POA: Diagnosis not present

## 2023-08-25 ENCOUNTER — Encounter (INDEPENDENT_AMBULATORY_CARE_PROVIDER_SITE_OTHER): Payer: Self-pay

## 2023-08-26 DIAGNOSIS — F4010 Social phobia, unspecified: Secondary | ICD-10-CM | POA: Diagnosis not present

## 2023-08-30 DIAGNOSIS — F4010 Social phobia, unspecified: Secondary | ICD-10-CM | POA: Diagnosis not present

## 2023-09-06 DIAGNOSIS — F4010 Social phobia, unspecified: Secondary | ICD-10-CM | POA: Diagnosis not present

## 2023-09-06 DIAGNOSIS — F422 Mixed obsessional thoughts and acts: Secondary | ICD-10-CM | POA: Diagnosis not present

## 2023-09-09 DIAGNOSIS — F4010 Social phobia, unspecified: Secondary | ICD-10-CM | POA: Diagnosis not present

## 2023-09-09 DIAGNOSIS — F422 Mixed obsessional thoughts and acts: Secondary | ICD-10-CM | POA: Diagnosis not present

## 2023-09-13 DIAGNOSIS — F4010 Social phobia, unspecified: Secondary | ICD-10-CM | POA: Diagnosis not present

## 2023-09-13 DIAGNOSIS — F422 Mixed obsessional thoughts and acts: Secondary | ICD-10-CM | POA: Diagnosis not present

## 2023-09-16 DIAGNOSIS — F422 Mixed obsessional thoughts and acts: Secondary | ICD-10-CM | POA: Diagnosis not present

## 2023-09-16 DIAGNOSIS — F4010 Social phobia, unspecified: Secondary | ICD-10-CM | POA: Diagnosis not present

## 2023-09-23 DIAGNOSIS — F422 Mixed obsessional thoughts and acts: Secondary | ICD-10-CM | POA: Diagnosis not present

## 2023-09-23 DIAGNOSIS — F4010 Social phobia, unspecified: Secondary | ICD-10-CM | POA: Diagnosis not present

## 2023-09-26 ENCOUNTER — Other Ambulatory Visit (INDEPENDENT_AMBULATORY_CARE_PROVIDER_SITE_OTHER): Payer: Self-pay | Admitting: Physician Assistant

## 2023-09-26 DIAGNOSIS — I10 Essential (primary) hypertension: Secondary | ICD-10-CM

## 2023-09-27 DIAGNOSIS — F422 Mixed obsessional thoughts and acts: Secondary | ICD-10-CM | POA: Diagnosis not present

## 2023-09-27 DIAGNOSIS — F4010 Social phobia, unspecified: Secondary | ICD-10-CM | POA: Diagnosis not present

## 2023-09-30 DIAGNOSIS — F4010 Social phobia, unspecified: Secondary | ICD-10-CM | POA: Diagnosis not present

## 2023-09-30 DIAGNOSIS — F422 Mixed obsessional thoughts and acts: Secondary | ICD-10-CM | POA: Diagnosis not present

## 2023-10-04 DIAGNOSIS — F4010 Social phobia, unspecified: Secondary | ICD-10-CM | POA: Diagnosis not present

## 2023-10-04 DIAGNOSIS — F422 Mixed obsessional thoughts and acts: Secondary | ICD-10-CM | POA: Diagnosis not present

## 2023-10-07 DIAGNOSIS — F422 Mixed obsessional thoughts and acts: Secondary | ICD-10-CM | POA: Diagnosis not present

## 2023-10-07 DIAGNOSIS — F4010 Social phobia, unspecified: Secondary | ICD-10-CM | POA: Diagnosis not present

## 2023-10-14 DIAGNOSIS — F422 Mixed obsessional thoughts and acts: Secondary | ICD-10-CM | POA: Diagnosis not present

## 2023-10-14 DIAGNOSIS — F4010 Social phobia, unspecified: Secondary | ICD-10-CM | POA: Diagnosis not present

## 2023-10-30 ENCOUNTER — Other Ambulatory Visit (INDEPENDENT_AMBULATORY_CARE_PROVIDER_SITE_OTHER): Payer: Self-pay | Admitting: Physician Assistant

## 2023-10-30 DIAGNOSIS — I10 Essential (primary) hypertension: Secondary | ICD-10-CM

## 2023-11-04 DIAGNOSIS — F422 Mixed obsessional thoughts and acts: Secondary | ICD-10-CM | POA: Diagnosis not present

## 2023-11-04 DIAGNOSIS — F4010 Social phobia, unspecified: Secondary | ICD-10-CM | POA: Diagnosis not present

## 2023-11-08 DIAGNOSIS — F4010 Social phobia, unspecified: Secondary | ICD-10-CM | POA: Diagnosis not present

## 2023-11-08 DIAGNOSIS — F422 Mixed obsessional thoughts and acts: Secondary | ICD-10-CM | POA: Diagnosis not present

## 2023-12-02 DIAGNOSIS — F4010 Social phobia, unspecified: Secondary | ICD-10-CM | POA: Diagnosis not present

## 2023-12-02 DIAGNOSIS — F422 Mixed obsessional thoughts and acts: Secondary | ICD-10-CM | POA: Diagnosis not present

## 2023-12-09 DIAGNOSIS — F4010 Social phobia, unspecified: Secondary | ICD-10-CM | POA: Diagnosis not present

## 2023-12-09 DIAGNOSIS — F422 Mixed obsessional thoughts and acts: Secondary | ICD-10-CM | POA: Diagnosis not present

## 2023-12-30 DIAGNOSIS — F1010 Alcohol abuse, uncomplicated: Secondary | ICD-10-CM | POA: Diagnosis not present

## 2023-12-30 DIAGNOSIS — F422 Mixed obsessional thoughts and acts: Secondary | ICD-10-CM | POA: Diagnosis not present

## 2024-01-13 DIAGNOSIS — F422 Mixed obsessional thoughts and acts: Secondary | ICD-10-CM | POA: Diagnosis not present

## 2024-01-13 DIAGNOSIS — F1010 Alcohol abuse, uncomplicated: Secondary | ICD-10-CM | POA: Diagnosis not present

## 2024-01-20 DIAGNOSIS — F422 Mixed obsessional thoughts and acts: Secondary | ICD-10-CM | POA: Diagnosis not present

## 2024-01-20 DIAGNOSIS — F4010 Social phobia, unspecified: Secondary | ICD-10-CM | POA: Diagnosis not present

## 2024-01-31 DIAGNOSIS — F422 Mixed obsessional thoughts and acts: Secondary | ICD-10-CM | POA: Diagnosis not present

## 2024-01-31 DIAGNOSIS — F4010 Social phobia, unspecified: Secondary | ICD-10-CM | POA: Diagnosis not present

## 2024-02-07 DIAGNOSIS — F422 Mixed obsessional thoughts and acts: Secondary | ICD-10-CM | POA: Diagnosis not present

## 2024-02-07 DIAGNOSIS — F4010 Social phobia, unspecified: Secondary | ICD-10-CM | POA: Diagnosis not present

## 2024-02-14 DIAGNOSIS — F4010 Social phobia, unspecified: Secondary | ICD-10-CM | POA: Diagnosis not present

## 2024-02-14 DIAGNOSIS — F422 Mixed obsessional thoughts and acts: Secondary | ICD-10-CM | POA: Diagnosis not present

## 2024-02-28 DIAGNOSIS — F422 Mixed obsessional thoughts and acts: Secondary | ICD-10-CM | POA: Diagnosis not present

## 2024-02-28 DIAGNOSIS — F4010 Social phobia, unspecified: Secondary | ICD-10-CM | POA: Diagnosis not present

## 2024-03-13 DIAGNOSIS — F4010 Social phobia, unspecified: Secondary | ICD-10-CM | POA: Diagnosis not present

## 2024-03-13 DIAGNOSIS — F422 Mixed obsessional thoughts and acts: Secondary | ICD-10-CM | POA: Diagnosis not present

## 2024-03-16 ENCOUNTER — Ambulatory Visit: Admitting: Primary Care

## 2024-03-20 DIAGNOSIS — F422 Mixed obsessional thoughts and acts: Secondary | ICD-10-CM | POA: Diagnosis not present

## 2024-03-20 DIAGNOSIS — F4010 Social phobia, unspecified: Secondary | ICD-10-CM | POA: Diagnosis not present

## 2024-03-27 DIAGNOSIS — F4010 Social phobia, unspecified: Secondary | ICD-10-CM | POA: Diagnosis not present

## 2024-03-27 DIAGNOSIS — F422 Mixed obsessional thoughts and acts: Secondary | ICD-10-CM | POA: Diagnosis not present

## 2024-04-02 DIAGNOSIS — F422 Mixed obsessional thoughts and acts: Secondary | ICD-10-CM | POA: Diagnosis not present

## 2024-04-02 DIAGNOSIS — F4010 Social phobia, unspecified: Secondary | ICD-10-CM | POA: Diagnosis not present

## 2024-04-03 DIAGNOSIS — F4010 Social phobia, unspecified: Secondary | ICD-10-CM | POA: Diagnosis not present

## 2024-04-04 DIAGNOSIS — F4010 Social phobia, unspecified: Secondary | ICD-10-CM | POA: Diagnosis not present

## 2024-04-06 DIAGNOSIS — L74510 Primary focal hyperhidrosis, axilla: Secondary | ICD-10-CM | POA: Diagnosis not present

## 2024-04-06 DIAGNOSIS — F4010 Social phobia, unspecified: Secondary | ICD-10-CM | POA: Diagnosis not present

## 2024-04-06 DIAGNOSIS — E669 Obesity, unspecified: Secondary | ICD-10-CM | POA: Diagnosis not present

## 2024-04-06 DIAGNOSIS — Z23 Encounter for immunization: Secondary | ICD-10-CM | POA: Diagnosis not present

## 2024-04-06 DIAGNOSIS — F411 Generalized anxiety disorder: Secondary | ICD-10-CM | POA: Diagnosis not present

## 2024-04-06 DIAGNOSIS — I10 Essential (primary) hypertension: Secondary | ICD-10-CM | POA: Diagnosis not present

## 2024-04-06 DIAGNOSIS — F429 Obsessive-compulsive disorder, unspecified: Secondary | ICD-10-CM | POA: Diagnosis not present

## 2024-04-06 DIAGNOSIS — F422 Mixed obsessional thoughts and acts: Secondary | ICD-10-CM | POA: Diagnosis not present

## 2024-04-10 DIAGNOSIS — F4010 Social phobia, unspecified: Secondary | ICD-10-CM | POA: Diagnosis not present

## 2024-04-13 DIAGNOSIS — F422 Mixed obsessional thoughts and acts: Secondary | ICD-10-CM | POA: Diagnosis not present

## 2024-04-13 DIAGNOSIS — F4010 Social phobia, unspecified: Secondary | ICD-10-CM | POA: Diagnosis not present

## 2024-04-17 DIAGNOSIS — F4010 Social phobia, unspecified: Secondary | ICD-10-CM | POA: Diagnosis not present
